# Patient Record
Sex: Female | Born: 1985 | Race: Black or African American | Hispanic: No | Marital: Married | Smoking: Never smoker
Health system: Southern US, Community
[De-identification: ages and names within clinical notes are randomized; demographics above are authoritative.]

## PROBLEM LIST (undated history)

## (undated) DIAGNOSIS — O139 Gestational [pregnancy-induced] hypertension without significant proteinuria, unspecified trimester: Secondary | ICD-10-CM

## (undated) HISTORY — PX: WISDOM TOOTH EXTRACTION: SHX21

---
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
## 2007-06-04 ENCOUNTER — Ambulatory Visit: Payer: Self-pay | Admitting: Internal Medicine

## 2013-06-23 ENCOUNTER — Encounter: Payer: Self-pay | Admitting: Gynecology

## 2013-06-23 ENCOUNTER — Ambulatory Visit (INDEPENDENT_AMBULATORY_CARE_PROVIDER_SITE_OTHER): Payer: BC Managed Care – PPO | Admitting: Gynecology

## 2013-06-23 VITALS — BP 100/62 | HR 68 | Resp 12 | Ht 63.25 in | Wt 155.0 lb

## 2013-06-23 DIAGNOSIS — Z124 Encounter for screening for malignant neoplasm of cervix: Secondary | ICD-10-CM

## 2013-06-23 DIAGNOSIS — Z01419 Encounter for gynecological examination (general) (routine) without abnormal findings: Secondary | ICD-10-CM

## 2013-06-23 DIAGNOSIS — Z0000 Encounter for general adult medical examination without abnormal findings: Secondary | ICD-10-CM

## 2013-06-23 DIAGNOSIS — Z309 Encounter for contraceptive management, unspecified: Secondary | ICD-10-CM

## 2013-06-23 LAB — POCT URINALYSIS DIPSTICK
UROBILINOGEN UA: NEGATIVE
pH, UA: 5

## 2013-06-23 LAB — HEMOGLOBIN, FINGERSTICK: HEMOGLOBIN, FINGERSTICK: 12.9 g/dL (ref 12.0–16.0)

## 2013-06-23 MED ORDER — NORETHIN ACE-ETH ESTRAD-FE 1-20 MG-MCG PO TABS: 1.0000 | ORAL_TABLET | Freq: Every day | ORAL | Status: DC

## 2013-06-23 NOTE — Patient Instructions (Signed)
Start ocp first day of flow and less break through bleeding and covered contraceptively

## 2013-06-23 NOTE — Progress Notes (Signed)
28 y.o. Married Caucasian female   G2P0020 here for annual exam. Pt is  currently sexually active.  She reports not using condoms on a regular basis.  First sexual activity at 28  years old, 8 number of lifetime partners.   Pt married 6/14, planning pregnancy 3-5y.  Pt reports history of elevated BP, not seen today but has run out of ocp.  Would like new Rx.   Patient's last menstrual period was 05/28/2013.          Sexually active: yes  The current method of family planning is OCP (estrogen/progesterone).    Exercising: yes  cardio, strength training 2-3x/wk Last pap: 05/2012 WNL Alcohol: 1 drink/wk Tobacco: no Drugs: no Gardisil: yes, completed: Freshmen year of college  Hgb: 12.9 ; Urine:  [place]   No health maintenance topics applied.  Family History  Problem Relation Age of Onset  . Hypertension Father     There are no active problems to display for this patient.   History reviewed. No pertinent past medical history.  History reviewed. No pertinent past surgical history.  Allergies: Asa  Current Outpatient Prescriptions  Medication Sig Dispense Refill  . Norethindrone Acet-Ethinyl Est (GILDESS 1/20 PO) Take by mouth.       No current facility-administered medications for this visit.    ROS: Pertinent items are noted in HPI.  Exam:    BP 100/62  Pulse 68  Resp 12  Ht 5' 3.25" (1.607 m)  Wt 155 lb (70.308 kg)  BMI 27.23 kg/m2  LMP 05/28/2013 Weight change: @WEIGHTCHANGE @ Last 3 height recordings:  Ht Readings from Last 3 Encounters:  06/23/13 5' 3.25" (1.607 m)   General appearance: alert, cooperative and appears stated age Head: Normocephalic, without obvious abnormality, atraumatic Neck: no adenopathy, no carotid bruit, no JVD, supple, symmetrical, trachea midline and thyroid not enlarged, symmetric, no tenderness/mass/nodules Lungs: clear to auscultation bilaterally Breasts: normal appearance, no masses or tenderness, fibrocystic right>left Heart:  regular rate and rhythm, S1, S2 normal, no murmur, click, rub or gallop Abdomen: soft, non-tender; bowel sounds normal; no masses,  no organomegaly Extremities: extremities normal, atraumatic, no cyanosis or edema Skin: Skin color, texture, turgor normal. No rashes or lesions Lymph nodes: Cervical, supraclavicular, and axillary nodes normal. no inguinal nodes palpated Neurologic: Grossly normal   Pelvic: External genitalia:  no lesions              Urethra: normal appearing urethra with no masses, tenderness or lesions              Bartholins and Skenes: normal                 Vagina: normal appearing vagina with normal color and discharge, no lesions              Cervix: normal appearance              Pap taken: yes        Bimanual Exam:  Uterus:  uterus is normal size, shape, consistency and nontender                                      Adnexa:    no masses  Rectovaginal: Confirms                                      Anus:  normal sphincter tone, no lesions  A: well woman no contraindication to begin use of oral contraceptives Contraceptive management     P: pap smear with reflex Pt with normal BP today off ocp, request ov in 93m to recheck BP, if elevated, will change contraception, 67m of loestrin 1/20 given counseled on breast self exam, use and side effects of OCP's, adequate intake of calcium and vitamin D, diet and exercise return annually or prn Discussed STD prevention, regular condom use.     An After Visit Summary was printed and given to the patient.

## 2013-06-25 LAB — IPS PAP TEST WITH REFLEX TO HPV

## 2013-06-28 ENCOUNTER — Telehealth: Payer: Self-pay | Admitting: *Deleted

## 2013-06-28 NOTE — Telephone Encounter (Signed)
Message copied by Lorraine LaxSHAW, Kellan Raffield J on Mon Jun 28, 2013 12:20 PM ------      Message from: Douglass RiversLATHROP, TRACY      Created: Mon Jun 28, 2013 11:16 AM       Inform bv on pap can treat if desires, tindamax 2g repeat in 1d 500mg , #8, recall 2 ------

## 2013-06-28 NOTE — Telephone Encounter (Signed)
Left Message To Call Back  

## 2013-07-02 ENCOUNTER — Telehealth: Payer: Self-pay | Admitting: *Deleted

## 2013-07-02 MED ORDER — TINIDAZOLE 500 MG PO TABS: 500.0000 mg | ORAL_TABLET | Freq: Once | ORAL | Status: DC

## 2013-07-02 NOTE — Telephone Encounter (Signed)
Patient notified see result note 

## 2013-07-02 NOTE — Telephone Encounter (Signed)
Message copied by Lorraine LaxSHAW, Sherlene Rickel J on Fri Jul 02, 2013 10:37 AM ------      Message from: Douglass RiversLATHROP, TRACY      Created: Mon Jun 28, 2013 11:16 AM       Inform bv on pap can treat if desires, tindamax 2g repeat in 1d 500mg , #8, recall 2 ------

## 2013-07-02 NOTE — Telephone Encounter (Signed)
Patient notified would like to have rx sent through to pharmacy. Tindamax 500 mg #8/0 refill [facility] to [facility] [place]. Patient is aware to take 4 tablets by mouth and then to repeat again in one day.  Routed to provider for review, encounter closed.

## 2013-09-22 ENCOUNTER — Ambulatory Visit: Payer: BC Managed Care – PPO | Admitting: Gynecology

## 2013-10-11 ENCOUNTER — Ambulatory Visit: Payer: BC Managed Care – PPO | Admitting: Gynecology

## 2013-10-15 ENCOUNTER — Ambulatory Visit (INDEPENDENT_AMBULATORY_CARE_PROVIDER_SITE_OTHER): Payer: BC Managed Care – PPO | Admitting: Gynecology

## 2013-10-15 VITALS — BP 112/60 | HR 76 | Resp 14 | Ht 63.25 in | Wt 156.0 lb

## 2013-10-15 DIAGNOSIS — Z3041 Encounter for surveillance of contraceptive pills: Secondary | ICD-10-CM

## 2013-10-15 MED ORDER — NORETHIN ACE-ETH ESTRAD-FE 1-20 MG-MCG PO TABS: 1.0000 | ORAL_TABLET | Freq: Every day | ORAL | Status: DC

## 2013-10-15 NOTE — Progress Notes (Signed)
Pt here for f/u of BP after starting lo-estrin 1/20, she has a history of elevated BP on her last ocp.  Pt states pill working well for her, cycles are very light to spotting. No dyspareunia.  ROS:neg BP: 112/60, prev 100/62  A/P: History of elevated bp on ocp, doing well on current pill,  Refill given until annual exam

## 2014-01-24 ENCOUNTER — Encounter: Payer: Self-pay | Admitting: Gynecology

## 2014-02-22 ENCOUNTER — Telehealth: Payer: Self-pay

## 2014-02-22 NOTE — Telephone Encounter (Signed)
lmtcb to reschedule AEX with Dr. Lathrop 

## 2014-05-30 ENCOUNTER — Other Ambulatory Visit: Payer: Self-pay | Admitting: *Deleted

## 2014-05-30 DIAGNOSIS — Z3041 Encounter for surveillance of contraceptive pills: Secondary | ICD-10-CM

## 2014-05-30 MED ORDER — NORETHIN ACE-ETH ESTRAD-FE 1-20 MG-MCG PO TABS: 1.0000 | ORAL_TABLET | Freq: Every day | ORAL | Status: DC

## 2014-05-30 NOTE — Telephone Encounter (Signed)
Medication refill request: OCP Last AEX:  06/23/13 TL Next AEX: 08/11/14 DL Last MMG (if hormonal medication request): None Refill authorized: 10/15/13 #3/2R. Needs refills until her appt 07/2014

## 2014-06-27 ENCOUNTER — Ambulatory Visit: Payer: BC Managed Care – PPO | Admitting: Gynecology

## 2014-08-11 ENCOUNTER — Ambulatory Visit (INDEPENDENT_AMBULATORY_CARE_PROVIDER_SITE_OTHER): Payer: BLUE CROSS/BLUE SHIELD | Admitting: Certified Nurse Midwife

## 2014-08-11 ENCOUNTER — Encounter: Payer: Self-pay | Admitting: Certified Nurse Midwife

## 2014-08-11 VITALS — BP 132/66 | HR 96 | Resp 18 | Ht 63.25 in | Wt 162.0 lb

## 2014-08-11 DIAGNOSIS — Z124 Encounter for screening for malignant neoplasm of cervix: Secondary | ICD-10-CM

## 2014-08-11 DIAGNOSIS — Z01419 Encounter for gynecological examination (general) (routine) without abnormal findings: Secondary | ICD-10-CM | POA: Diagnosis not present

## 2014-08-11 DIAGNOSIS — Z0000 Encounter for general adult medical examination without abnormal findings: Secondary | ICD-10-CM

## 2014-08-11 LAB — CBC
HCT: 41 % (ref 36.0–46.0)
Hemoglobin: 13.8 g/dL (ref 12.0–15.0)
MCH: 30.3 pg (ref 26.0–34.0)
MCHC: 33.7 g/dL (ref 30.0–36.0)
MCV: 89.9 fL (ref 78.0–100.0)
MPV: 11 fL (ref 8.6–12.4)
Platelets: 279 10*3/uL (ref 150–400)
RBC: 4.56 MIL/uL (ref 3.87–5.11)
RDW: 12.7 % (ref 11.5–15.5)
WBC: 10.5 10*3/uL (ref 4.0–10.5)

## 2014-08-11 LAB — POCT URINALYSIS DIPSTICK
Bilirubin, UA: NEGATIVE
Glucose, UA: NEGATIVE
KETONES UA: NEGATIVE
LEUKOCYTES UA: NEGATIVE
Nitrite, UA: NEGATIVE
PROTEIN UA: NEGATIVE
RBC UA: NEGATIVE
Urobilinogen, UA: NEGATIVE
pH, UA: 5

## 2014-08-11 LAB — POCT URINE PREGNANCY: Preg Test, Ur: NEGATIVE

## 2014-08-11 NOTE — Progress Notes (Signed)
29 y.o. G2P0020 Married  African American Fe here for annual exam. Periods normal. Contraception OCP stopped one month ago. Patient trying for pregnancy, not actively trying, but will be happy if occurs. Has not started on Prenatal vitamins. Patient is non smoker and has stopped any alcohol use. See PCP only if needed. No other health issues today.  Patient's last menstrual period was 07/11/2014.          Sexually active: Yes.    The current method of family planning is none.    Exercising: Yes.    Gym or at home work outs Smoker:  no  Health Maintenance: Pap:  06/23/13 Negative  MMG:  Never Self Breast Exam: yes, once a month  Gardasil completed. TDaP: ? 2006 Labs: Here today  UA= Negative UPT=Negative    reports that she has never smoked. She has never used smokeless tobacco. She reports that she drinks about 0.5 oz of alcohol per week. She reports that she does not use illicit drugs.  History reviewed. No pertinent past medical history.  History reviewed. No pertinent past surgical history.  No current outpatient prescriptions on file.   No current facility-administered medications for this visit.    Family History  Problem Relation Age of Onset  . Hypertension Father     ROS:  Pertinent items are noted in HPI.  Otherwise, a comprehensive ROS was negative.  Exam:   BP 132/66 mmHg  Pulse 96  Resp 18  Ht 5' 3.25" (1.607 m)  Wt 162 lb (73.483 kg)  BMI 28.45 kg/m2  LMP 07/11/2014 Height: 5' 3.25" (160.7 cm) Ht Readings from Last 3 Encounters:  08/11/14 5' 3.25" (1.607 m)  10/15/13 5' 3.25" (1.607 m)  06/23/13 5' 3.25" (1.607 m)    General appearance: alert, cooperative and appears stated age Head: Normocephalic, without obvious abnormality, atraumatic Neck: no adenopathy, supple, symmetrical, trachea midline and thyroid normal to inspection and palpation Lungs: clear to auscultation bilaterally Breasts: normal appearance, no masses or tenderness, No nipple retraction  or dimpling, No nipple discharge or bleeding, No axillary or supraclavicular adenopathy Heart: regular rate and rhythm Abdomen: soft, non-tender; no masses,  no organomegaly Extremities: extremities normal, atraumatic, no cyanosis or edema Skin: Skin color, texture, turgor normal. No rashes or lesions Lymph nodes: Cervical, supraclavicular, and axillary nodes normal. No abnormal inguinal nodes palpated Neurologic: Grossly normal   Pelvic: External genitalia:  no lesions              Urethra:  normal appearing urethra with no masses, tenderness or lesions              Bartholin's and Skene's: normal                 Vagina: normal appearing vagina with normal color and discharge, no lesions              Cervix: normal, non tender, no lesions              Pap taken: Yes.   Bimanual Exam:  Uterus:  normal size, contour, position, consistency, mobility, non-tender              Adnexa: normal adnexa and no mass, fullness, tenderness               Rectovaginal: Confirms               Anus:  normal sphincter tone, no lesions  Chaperone present: Yes  A:  Well Woman with normal  exam  Contraception none hopeful for pregnancy  Immunization update  P:   Reviewed health and wellness pertinent to exam  Discussed importance of starting prenatal vitamins and importance of folic acid in diet for healthy pregnancy. Patient will start on OTC. Recommend Rubella screen and CBC, patient agreeable. Discussed good diet, regular exercise during the time prior to conception. Given handout on preparing for pregnancy. Patient encouraged to call if no pregnancy in the next  9 months, can take up to one year. Questions addressed.  Discussed risks/benefits of TDAP and pregnancy concerns with Pertussis. Declines today.  Pap smear taken with HPV reflex   counseled on breast self exam, adequate intake of calcium and vitamin D, diet and exercise return annually or prn  An After Visit Summary was printed and given to  the patient.

## 2014-08-11 NOTE — Patient Instructions (Signed)
General topics  Next pap or exam is  due in 1 year Take a Women's multivitamin Take 1200 mg. of calcium daily - prefer dietary If any concerns in interim to call back  Breast Self-Awareness Practicing breast self-awareness may pick up problems early, prevent significant medical complications, and possibly save your life. By practicing breast self-awareness, you can become familiar with how your breasts look and feel and if your breasts are changing. This allows you to notice changes early. It can also offer you some reassurance that your breast health is good. One way to learn what is normal for your breasts and whether your breasts are changing is to do a breast self-exam. If you find a lump or something that was not present in the past, it is best to contact your caregiver right away. Other findings that should be evaluated by your caregiver include nipple discharge, especially if it is bloody; skin changes or reddening; areas where the skin seems to be pulled in (retracted); or new lumps and bumps. Breast pain is seldom associated with cancer (malignancy), but should also be evaluated by a caregiver. BREAST SELF-EXAM The best time to examine your breasts is 5 7 days after your menstrual period is over.  ExitCare Patient Information 2013 [employer].   Exercise to Stay Healthy Exercise helps you become and stay healthy. EXERCISE IDEAS AND TIPS Choose exercises that:  You enjoy.  Fit into your day. You do not need to exercise really hard to be healthy. You can do exercises at a slow or medium level and stay healthy. You can:  Stretch before and after working out.  Try yoga, Pilates, or tai chi.  Lift weights.  Walk fast, swim, jog, run, climb stairs, bicycle, dance, or rollerskate.  Take aerobic classes. Exercises that burn about 150 calories:  Running 1  miles in 15 minutes.  Playing volleyball for 45 to 60 minutes.  Washing and waxing a car for 45 to 60  minutes.  Playing touch football for 45 minutes.  Walking 1  miles in 35 minutes.  Pushing a stroller 1  miles in 30 minutes.  Playing basketball for 30 minutes.  Raking leaves for 30 minutes.  Bicycling 5 miles in 30 minutes.  Walking 2 miles in 30 minutes.  Dancing for 30 minutes.  Shoveling snow for 15 minutes.  Swimming laps for 20 minutes.  Walking up stairs for 15 minutes.  Bicycling 4 miles in 15 minutes.  Gardening for 30 to 45 minutes.  Jumping rope for 15 minutes.  Washing windows or floors for 45 to 60 minutes. Document Released: 04/13/2010 Document Revised: 06/03/2011 Document Reviewed: 04/13/2010 ExitCare Patient Information 2013 [employer].   Other topics ( that may be useful information):    Sexually Transmitted Disease Sexually transmitted disease (STD) refers to any infection that is passed from person to person during sexual activity. This may happen by way of saliva, semen, blood, vaginal mucus, or urine. Common STDs include:  Gonorrhea.  Chlamydia.  Syphilis.  HIV/AIDS.  Genital herpes.  Hepatitis B and C.  Trichomonas.  Human papillomavirus (HPV).  Pubic lice. CAUSES  An STD may be spread by bacteria, virus, or parasite. A person can get an STD by:  Sexual intercourse with an infected person.  Sharing sex toys with an infected person.  Sharing needles with an infected person.  Having intimate contact with the genitals, mouth, or rectal areas of an infected person. SYMPTOMS  Some people may not have any symptoms, but   they can still pass the infection to others. Different STDs have different symptoms. Symptoms include:  Painful or bloody urination.  Pain in the pelvis, abdomen, vagina, anus, throat, or eyes.  Skin rash, itching, irritation, growths, or sores (lesions). These usually occur in the genital or anal area.  Abnormal vaginal discharge.  Penile discharge in men.  Soft, flesh-colored skin growths in the  genital or anal area.  Fever.  Pain or bleeding during sexual intercourse.  Swollen glands in the groin area.  Yellow skin and eyes (jaundice). This is seen with hepatitis. DIAGNOSIS  To make a diagnosis, your caregiver may:  Take a medical history.  Perform a physical exam.  Take a specimen (culture) to be examined.  Examine a sample of discharge under a microscope.  Perform blood test TREATMENT   Chlamydia, gonorrhea, trichomonas, and syphilis can be cured with antibiotic medicine.  Genital herpes, hepatitis, and HIV can be treated, but not cured, with prescribed medicines. The medicines will lessen the symptoms.  Genital warts from HPV can be treated with medicine or by freezing, burning (electrocautery), or surgery. Warts may come back.  HPV is a virus and cannot be cured with medicine or surgery.However, abnormal areas may be followed very closely by your caregiver and may be removed from the cervix, vagina, or vulva through office procedures or surgery. If your diagnosis is confirmed, your recent sexual partners need treatment. This is true even if they are symptom-free or have a negative culture or evaluation. They should not have sex until their caregiver says it is okay. HOME CARE INSTRUCTIONS  All sexual partners should be informed, tested, and treated for all STDs.  Take your antibiotics as directed. Finish them even if you start to feel better.  Only take over-the-counter or prescription medicines for pain, discomfort, or fever as directed by your caregiver.  Rest.  Eat a balanced diet and drink enough fluids to keep your urine clear or pale yellow.  Do not have sex until treatment is completed and you have followed up with your caregiver. STDs should be checked after treatment.  Keep all follow-up appointments, Pap tests, and blood tests as directed by your caregiver.  Only use latex condoms and water-soluble lubricants during sexual activity. Do not use  petroleum jelly or oils.  Avoid alcohol and illegal drugs.  Get vaccinated for HPV and hepatitis. If you have not received these vaccines in the past, talk to your caregiver about whether one or both might be right for you.  Avoid risky sex practices that can break the skin. The only way to avoid getting an STD is to avoid all sexual activity.Latex condoms and dental dams (for oral sex) will help lessen the risk of getting an STD, but will not completely eliminate the risk. SEEK MEDICAL CARE IF:   You have a fever.  You have any new or worsening symptoms. Document Released: 06/01/2002 Document Revised: 06/03/2011 Document Reviewed: 06/08/2010 [facility] Patient Information 2013 [place].    Domestic Abuse You are being battered or abused if someone close to you hits, pushes, or physically hurts you in any way. You also are being abused if you are forced into activities. You are being sexually abused if you are forced to have sexual contact of any kind. You are being emotionally abused if you are made to feel worthless or if you are constantly threatened. It is important to remember that help is available. No one has the right to abuse you. PREVENTION OF FURTHER  ABUSE  Learn the warning signs of danger. This varies with situations but may include: the use of alcohol, threats, isolation from friends and family, or forced sexual contact. Leave if you feel that violence is going to occur.  If you are attacked or beaten, report it to the police so the abuse is documented. You do not have to press charges. The police can protect you while you or the attackers are leaving. Get the officer's name and badge number and a copy of the report.  Find someone you can trust and tell them what is happening to you: your caregiver, a nurse, clergy member, close friend or family member. Feeling ashamed is natural, but remember that you have done nothing wrong. No one deserves abuse. Document Released:  03/08/2000 Document Revised: 06/03/2011 Document Reviewed: 05/17/2010 ExitCare Patient Information 2013 [employer].    How Much is Too Much Alcohol? Drinking too much alcohol can cause injury, accidents, and health problems. These types of problems can include:   Car crashes.  Falls.  Family fighting (domestic violence).  Drowning.  Fights.  Injuries.  Burns.  Damage to certain organs.  Having a baby with birth defects. ONE DRINK CAN BE TOO MUCH WHEN YOU ARE:  Working.  Pregnant or breastfeeding.  Taking medicines. Ask your doctor.  Driving or planning to drive. If you or someone you know has a drinking problem, get help from a doctor.  Document Released: 01/05/2009 Document Revised: 06/03/2011 Document Reviewed: 01/05/2009 ExitCare Patient Information 2013 [employer].   Smoking Hazards Smoking cigarettes is extremely bad for your health. Tobacco smoke has over 200 known poisons in it. There are over 60 chemicals in tobacco smoke that cause cancer. Some of the chemicals found in cigarette smoke include:   Cyanide.  Benzene.  Formaldehyde.  Methanol (wood alcohol).  Acetylene (fuel used in welding torches).  Ammonia. Cigarette smoke also contains the poisonous gases nitrogen oxide and carbon monoxide.  Cigarette smokers have an increased risk of many serious medical problems and Smoking causes approximately:  90% of all lung cancer deaths in men.  80% of all lung cancer deaths in women.  90% of deaths from chronic obstructive lung disease. Compared with nonsmokers, smoking increases the risk of:  Coronary heart disease by 2 to 4 times.  Stroke by 2 to 4 times.  Men developing lung cancer by 23 times.  Women developing lung cancer by 13 times.  Dying from chronic obstructive lung diseases by 12 times.  . Smoking is the most preventable cause of death and disease in our society.  WHY IS SMOKING ADDICTIVE?  Nicotine is the chemical  agent in tobacco that is capable of causing addiction or dependence.  When you smoke and inhale, nicotine is absorbed rapidly into the bloodstream through your lungs. Nicotine absorbed through the lungs is capable of creating a powerful addiction. Both inhaled and non-inhaled nicotine may be addictive.  Addiction studies of cigarettes and spit tobacco show that addiction to nicotine occurs mainly during the teen years, when young people begin using tobacco products. WHAT ARE THE BENEFITS OF QUITTING?  There are many health benefits to quitting smoking.   Likelihood of developing cancer and heart disease decreases. Health improvements are seen almost immediately.  Blood pressure, pulse rate, and breathing patterns start returning to normal soon after quitting. QUITTING SMOKING   American Lung Association - 1-800-LUNGUSA  American Cancer Society - 1-800-ACS-2345 Document Released: 04/18/2004 Document Revised: 06/03/2011 Document Reviewed: 12/21/2008 ExitCare Patient Information 2013 ExitCare[employer]   LL[employer].   Stress Management Stress is a state of physical or mental tension that often results from changes in your life or normal routine. Some common causes of stress are:  Death of a loved one.  Injuries or severe illnesses.  Getting fired or changing jobs.  Moving into a new home. Other causes may be:  Sexual problems.  Business or financial losses.  Taking on a large debt.  Regular conflict with someone at home or at work.  Constant tiredness from lack of sleep. It is not just bad things that are stressful. It may be stressful to:  Win the lottery.  Get married.  Buy a new car. The amount of stress that can be easily tolerated varies from person to person. Changes generally cause stress, regardless of the types of change. Too much stress can affect your health. It may lead to physical or emotional problems. Too little stress (boredom) may also become stressful. SUGGESTIONS TO  REDUCE STRESS:  Talk things over with your family and friends. It often is helpful to share your concerns and worries. If you feel your problem is serious, you may want to get help from a professional counselor.  Consider your problems one at a time instead of lumping them all together. Trying to take care of everything at once may seem impossible. List all the things you need to do and then start with the most important one. Set a goal to accomplish 2 or 3 things each day. If you expect to do too many in a single day you will naturally fail, causing you to feel even more stressed.  Do not use alcohol or drugs to relieve stress. Although you may feel better for a short time, they do not remove the problems that caused the stress. They can also be habit forming.  Exercise regularly - at least 3 times per week. Physical exercise can help to relieve that "uptight" feeling and will relax you.  The shortest distance between despair and hope is often a good night's sleep.  Go to bed and get up on time allowing yourself time for appointments without being rushed.  Take a short "time-out" period from any stressful situation that occurs during the day. Close your eyes and take some deep breaths. Starting with the muscles in your face, tense them, hold it for a few seconds, then relax. Repeat this with the muscles in your neck, shoulders, hand, stomach, back and legs.  Take good care of yourself. Eat a balanced diet and get plenty of rest.  Schedule time for having fun. Take a break from your daily routine to relax. HOME CARE INSTRUCTIONS   Call if you feel overwhelmed by your problems and feel you can no longer manage them on your own.  Return immediately if you feel like hurting yourself or someone else. Document Released: 09/04/2000 Document Revised: 06/03/2011 Document Reviewed: 04/27/2007 [facility] Patient Information 2013 [place].  Prenatal Care  WHAT IS PRENATAL CARE?  Prenatal care  means health care during your pregnancy, before your baby is born. It is very important to take care of yourself and your baby during your pregnancy by:   Getting early prenatal care. If you know you are pregnant, or think you might be pregnant, call your health care provider as soon as possible. Schedule a visit for a prenatal exam.  Getting regular prenatal care. Follow your health care provider's schedule for blood and other necessary tests. Do not miss appointments.  Doing everything you can  to keep yourself and your baby healthy during your pregnancy.  Getting complete care. Prenatal care should include evaluation of the medical, dietary, educational, psychological, and social needs of you and your significant other. The medical and genetic history of your family and the family of your baby's father should be discussed with your health care provider.  Discussing with your health care provider:  Prescription, over-the-counter, and herbal medicines that you take.  Any history of substance abuse, alcohol use, smoking, and illegal drug use.  Any history of domestic abuse and violence.  Immunizations you have received.  Your nutrition and diet.  The amount of exercise you do.  Any environmental and occupational hazards to which you are exposed.  History of sexually transmitted infections for both you and your partner.  Previous pregnancies you have had. WHY IS PRENATAL CARE SO IMPORTANT?  By regularly seeing your health care provider, you help ensure that problems can be identified early so that they can be treated as soon as possible. Other problems might be prevented. Many studies have shown that early and regular prenatal care is important for the health of mothers and their babies.  HOW CAN I TAKE CARE OF MYSELF WHILE I AM PREGNANT?  Here are ways to take care of yourself and your baby:   Start or continue taking your multivitamin with 400 micrograms (mcg) of folic acid every  day.  Get early and regular prenatal care. It is very important to see a health care provider during your pregnancy. Your health care provider will check at each visit to make sure that you and your baby are healthy. If there are any problems, action can be taken right away to help you and your baby.  Eat a healthy diet that includes:  Fruits.  Vegetables.  Foods low in saturated fat.  Whole grains.  Calcium-rich foods, such as milk, yogurt, and hard cheeses.  Drink 6-8 glasses of liquids a day.  Unless your health care provider tells you not to, try to be physically active for 30 minutes, most days of the week. If you are pressed for time, you can get your activity in through 10-minute segments, three times a day.  Do not smoke, drink alcohol, or use drugs. These can cause long-term damage to your baby. Talk with your health care provider about steps to take to stop smoking. Talk with a member of your faith community, a counselor, a trusted friend, or your health care provider if you are concerned about your alcohol or drug use.  Ask your health care provider before taking any medicine, even over-the-counter medicines. Some medicines are not safe to take during pregnancy.  Get plenty of rest and sleep.  Avoid hot tubs and saunas during pregnancy.  Do not have X-rays taken unless absolutely necessary and with the recommendation of your health care provider. A lead shield can be placed on your abdomen to protect your baby when X-rays are taken in other parts of your body.  Do not empty the cat litter when you are pregnant. It may contain a parasite that causes an infection called toxoplasmosis, which can cause birth defects. Also, use gloves when working in garden areas used by cats.  Do not eat uncooked or undercooked meats or fish.  Do not eat soft, mold-ripened cheeses (Brie, Camembert, and chevre) or soft, blue-veined cheese (Danish blue and Roquefort).  Stay away from toxic  chemicals like:  Insecticides.  Solvents (some cleaners or paint thinners).  Lead.  Mercury.  Sexual intercourse may continue until the end of the pregnancy, unless you have a medical problem or there is a problem with the pregnancy and your health care provider tells you not to.  Do not wear high-heel shoes, especially during the second half of the pregnancy. You can lose your balance and fall.  Do not take long trips, unless absolutely necessary. Be sure to see your health care provider before going on the trip.  Do not sit in one position for more than 2 hours when on a trip.  Take a copy of your medical records when going on a trip. Know where a hospital is located in the city you are visiting, in case of an emergency.  Most dangerous household products will have pregnancy warnings on their labels. Ask your health care provider about products if you are unsure.  Limit or eliminate your caffeine intake from coffee, tea, sodas, medicines, and chocolate.  Many women continue working through pregnancy. Staying active might help you stay healthier. If you have a question about the safety or the hours you work at your particular job, talk with your health care provider.  Get informed:  Read books.  Watch videos.  Go to childbirth classes for you and your significant other.  Talk with experienced moms.  Ask your health care provider about childbirth education classes for you and your partner. Classes can help you and your partner prepare for the birth of your baby.  Ask about a baby doctor (pediatrician) and methods and pain medicine for labor, delivery, and possible cesarean delivery. HOW OFTEN SHOULD I SEE MY HEALTH CARE PROVIDER DURING PREGNANCY?  Your health care provider will give you a schedule for your prenatal visits. You will have visits more often as you get closer to the end of your pregnancy. An average pregnancy lasts about 40 weeks.  A typical schedule includes  visiting your health care provider:   About once each month during your first 6 months of pregnancy.  Every 2 weeks during the next 2 months.  Weekly in the last month, until the delivery date. Your health care provider will probably want to see you more often if:  You are older than 35 years.  Your pregnancy is high risk because you have certain health problems or problems with the pregnancy, such as:  Diabetes.  High blood pressure.  The baby is not growing on schedule, according to the dates of the pregnancy. Your health care provider will do special tests to make sure you and your baby are not having any serious problems. WHAT HAPPENS DURING PRENATAL VISITS?   At your first prenatal visit, your health care provider will do a physical exam and talk to you about your health history and the health history of your partner and your family. Your health care provider will be able to tell you what date to expect your baby to be born on.  Your first physical exam will include checks of your blood pressure, measurements of your height and weight, and an exam of your pelvic organs. Your health care provider will do a Pap test if you have not had one recently and will do cultures of your cervix to make sure there is no infection.  At each prenatal visit, there will be tests of your blood, urine, blood pressure, weight, and the progress of the baby will be checked.  At your later prenatal visits, your health care provider will check how you are doing and how your baby  is developing. You may have a number of tests done as your pregnancy progresses.  Ultrasound exams are often used to check on your baby's growth and health.  You may have more urine and blood tests, as well as special tests, if needed. These may include amniocentesis to examine fluid in the pregnancy sac, stress tests to check how the baby responds to contractions, or a biophysical profile to measure your baby's well-being. Your  health care provider will explain the tests and why they are necessary.  You should be tested for high blood sugar (gestational diabetes) between the 24th and 28th weeks of your pregnancy.  You should discuss with your health care provider your plans to breastfeed or bottle-feed your baby.  Each visit is also a chance for you to learn about staying healthy during pregnancy and to ask questions. Document Released: 03/14/2003 Document Revised: 03/16/2013 Document Reviewed: 05/26/2013 [facility] Patient Information 2015 [place], [place]. This information is not intended to replace advice given to you by your health care provider. Make sure you discuss any questions you have with your health care provider.

## 2014-08-11 NOTE — Progress Notes (Signed)
Reviewed personally.  M. Suzanne Venus Gilles, MD.  

## 2014-08-12 LAB — RUBELLA SCREEN: Rubella: 7.28 Index — ABNORMAL HIGH (ref <0.90)

## 2014-08-13 LAB — IPS PAP TEST WITH REFLEX TO HPV

## 2014-08-22 ENCOUNTER — Other Ambulatory Visit: Payer: Self-pay | Admitting: Certified Nurse Midwife

## 2014-08-23 NOTE — Telephone Encounter (Signed)
Medication refill request: Junel  Last AEX:  08/11/14 DL Next AEX: [id number]/455/25/[id number] DL Last MMG (if hormonal medication request):  Refill authorized: pt no longer on OCP

## 2014-10-23 ENCOUNTER — Encounter: Payer: Self-pay | Admitting: *Deleted

## 2014-10-23 ENCOUNTER — Emergency Department: Payer: BLUE CROSS/BLUE SHIELD

## 2014-10-23 ENCOUNTER — Emergency Department
Admission: EM | Admit: 2014-10-23 | Discharge: 2014-10-23 | Disposition: A | Discharge status: Home / Self Care | Payer: BLUE CROSS/BLUE SHIELD | Attending: Emergency Medicine | Admitting: Emergency Medicine

## 2014-10-23 DIAGNOSIS — Y92410 Unspecified street and highway as the place of occurrence of the external cause: Secondary | ICD-10-CM | POA: Diagnosis not present

## 2014-10-23 DIAGNOSIS — S20211A Contusion of right front wall of thorax, initial encounter: Secondary | ICD-10-CM | POA: Insufficient documentation

## 2014-10-23 DIAGNOSIS — Z79899 Other long term (current) drug therapy: Secondary | ICD-10-CM | POA: Diagnosis not present

## 2014-10-23 DIAGNOSIS — S1081XA Abrasion of other specified part of neck, initial encounter: Secondary | ICD-10-CM | POA: Insufficient documentation

## 2014-10-23 DIAGNOSIS — Y998 Other external cause status: Secondary | ICD-10-CM | POA: Insufficient documentation

## 2014-10-23 DIAGNOSIS — Y9389 Activity, other specified: Secondary | ICD-10-CM | POA: Diagnosis not present

## 2014-10-23 DIAGNOSIS — S299XXA Unspecified injury of thorax, initial encounter: Secondary | ICD-10-CM | POA: Diagnosis present

## 2014-10-23 MED ORDER — CYCLOBENZAPRINE HCL 10 MG PO TABS: 10.0000 mg | ORAL_TABLET | Freq: Three times a day (TID) | ORAL | Status: DC | PRN

## 2014-10-23 MED ORDER — IBUPROFEN 800 MG PO TABS
800.0000 mg | ORAL_TABLET | Freq: Once | ORAL | Status: AC
  Administered 2014-10-23: 800 mg via ORAL
  Filled 2014-10-23: qty 1

## 2014-10-23 MED ORDER — CYCLOBENZAPRINE HCL 10 MG PO TABS
10.0000 mg | ORAL_TABLET | Freq: Once | ORAL | Status: AC
  Administered 2014-10-23: 10 mg via ORAL
  Filled 2014-10-23: qty 1

## 2014-10-23 MED ORDER — IBUPROFEN 800 MG PO TABS: 800.0000 mg | ORAL_TABLET | Freq: Three times a day (TID) | ORAL | Status: DC | PRN
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
## 2014-10-23 NOTE — Discharge Instructions (Signed)
Chest Contusion °A chest contusion is a deep bruise on your chest area. Contusions are the result of an injury that caused bleeding under the skin. A chest contusion may involve bruising of the skin, muscles, or ribs. The contusion may turn blue, purple, or yellow. Minor injuries will give you a painless contusion, but more severe contusions may stay painful and swollen for a few weeks. °CAUSES  °A contusion is usually caused by a blow, trauma, or direct force to an area of the body. °SYMPTOMS  °· Swelling and redness of the injured area. °· Discoloration of the injured area. °· Tenderness and soreness of the injured area. °· Pain. °DIAGNOSIS  °The diagnosis can be made by taking a history and performing a physical exam. An X-ray, CT scan, or MRI may be needed to determine if there were any associated injuries, such as broken bones (fractures) or internal injuries. °TREATMENT  °Often, the best treatment for a chest contusion is resting, icing, and applying cold compresses to the injured area. Deep breathing exercises may be recommended to reduce the risk of pneumonia. Over-the-counter medicines may also be recommended for pain control. °HOME CARE INSTRUCTIONS  °· Put ice on the injured area. °· Put ice in a plastic bag. °· Place a towel between your skin and the bag. °· Leave the ice on for 15-20 minutes, 03-04 times a day. °· Only take over-the-counter or prescription medicines as directed by your caregiver. Your caregiver may recommend avoiding anti-inflammatory medicines (aspirin, ibuprofen, and naproxen) for 48 hours because these medicines may increase bruising. °· Rest the injured area. °· Perform deep-breathing exercises as directed by your caregiver. °· Stop smoking if you smoke. °· Do not lift objects over 5 pounds (2.3 kg) for 3 days or longer if recommended by your caregiver. °SEEK IMMEDIATE MEDICAL CARE IF:  °· You have increased bruising or swelling. °· You have pain that is getting worse. °· You have  difficulty breathing. °· You have dizziness, weakness, or fainting. °· You have blood in your urine or stool. °· You cough up or vomit blood. °· Your swelling or pain is not relieved with medicines. °MAKE SURE YOU:  °· Understand these instructions. °· Will watch your condition. °· Will get help right away if you are not doing well or get worse. °Document Released: 12/04/2000 Document Revised: 12/04/2011 Document Reviewed: 09/02/2011 °[employer]® Patient Information ©2015 [employer]. This information is not intended to replace advice given to you by your health care provider. Make sure you discuss any questions you have with your health care provider. ° °Motor Vehicle Collision °It is common to have multiple bruises and sore muscles after a motor vehicle collision (MVC). These tend to feel worse for the first 24 hours. You may have the most stiffness and soreness over the first several hours. You may also feel worse when you wake up the first morning after your collision. After this point, you will usually begin to improve with each day. The speed of improvement often depends on the severity of the collision, the number of injuries, and the location and nature of these injuries. °HOME CARE INSTRUCTIONS °· Put ice on the injured area. °¨ Put ice in a plastic bag. °¨ Place a towel between your skin and the bag. °¨ Leave the ice on for 15-20 minutes, 3-4 times a day, or as directed by your health care provider. °· Drink enough fluids to keep your urine clear or pale yellow. Do not drink alcohol. °· Take a   warm shower or bath once or twice a day. This will increase blood flow to sore muscles. °· You may return to activities as directed by your caregiver. Be careful when lifting, as this may aggravate neck or back pain. °· Only take over-the-counter or prescription medicines for pain, discomfort, or fever as directed by your caregiver. Do not use aspirin. This may increase bruising and bleeding. °SEEK IMMEDIATE MEDICAL  CARE IF: °· You have numbness, tingling, or weakness in the arms or legs. °· You develop severe headaches not relieved with medicine. °· You have severe neck pain, especially tenderness in the middle of the back of your neck. °· You have changes in bowel or bladder control. °· There is increasing pain in any area of the body. °· You have shortness of breath, light-headedness, dizziness, or fainting. °· You have chest pain. °· You feel sick to your stomach (nauseous), throw up (vomit), or sweat. °· You have increasing abdominal discomfort. °· There is blood in your urine, stool, or vomit. °· You have pain in your shoulder (shoulder strap areas). °· You feel your symptoms are getting worse. °MAKE SURE YOU: °· Understand these instructions. °· Will watch your condition. °· Will get help right away if you are not doing well or get worse. °Document Released: 03/11/2005 Document Revised: 07/26/2013 Document Reviewed: 08/08/2010 °Exit[employer]® Patient Information ©2015 [employer]. This information is not intended to replace advice given to you by your health care provider. Make sure you discuss any questions you have with your health care provider. ° °

## 2015-08-17 ENCOUNTER — Ambulatory Visit (INDEPENDENT_AMBULATORY_CARE_PROVIDER_SITE_OTHER): Payer: BLUE CROSS/BLUE SHIELD | Admitting: Certified Nurse Midwife

## 2015-08-17 ENCOUNTER — Encounter: Payer: Self-pay | Admitting: Certified Nurse Midwife

## 2015-08-17 VITALS — BP 104/68 | HR 72 | Resp 16 | Ht 63.25 in | Wt 157.0 lb

## 2015-08-17 DIAGNOSIS — Z30011 Encounter for initial prescription of contraceptive pills: Secondary | ICD-10-CM

## 2015-08-17 DIAGNOSIS — Z01419 Encounter for gynecological examination (general) (routine) without abnormal findings: Secondary | ICD-10-CM | POA: Diagnosis not present

## 2015-08-17 DIAGNOSIS — Z0000 Encounter for general adult medical examination without abnormal findings: Secondary | ICD-10-CM

## 2015-08-17 LAB — POCT URINALYSIS DIPSTICK
Bilirubin, UA: NEGATIVE
GLUCOSE UA: NEGATIVE
Ketones, UA: NEGATIVE
LEUKOCYTES UA: NEGATIVE
NITRITE UA: NEGATIVE
Protein, UA: NEGATIVE
RBC UA: NEGATIVE
UROBILINOGEN UA: NEGATIVE
pH, UA: 5

## 2015-08-17 MED ORDER — NORETHIN ACE-ETH ESTRAD-FE 1-20 MG-MCG PO TABS: 1.0000 | ORAL_TABLET | Freq: Every day | ORAL | Status: DC

## 2015-08-17 NOTE — Progress Notes (Signed)
30 y.o. G2P0020 Married  African American Fe here for annual exam. Periods normal, no issues. Has been using NFP over the past year with ovulation patterns, now would like to be back on OCP. Used Loestrin in the past with no issues. Had BP elevation with another OCP, and used POP previous to our practice. Non smoker, no migraine headache issues. Sees Urgent care if needed. No other health issues today. Planning a cruise this summer!  Patient's last menstrual period was 07/28/2015.          Sexually active: Yes.    The current method of family planning is rhythm method.    Exercising: Yes.    cardio & weights Smoker:  no  Health Maintenance: Pap:  08-11-14 neg MMG:  none Colonoscopy:  none BMD:   none TDaP:  2006 Shingles: no Pneumonia: no Hep C and HIV: none Labs: poct urine-neg, hgb-13.8 Self breast exam: done monthly   reports that she has never smoked. She has never used smokeless tobacco. She reports that she drinks about 0.6 oz of alcohol per week. She reports that she does not use illicit drugs.  History reviewed. No pertinent past medical history.  History reviewed. No pertinent past surgical history.  Current Outpatient Prescriptions  Medication Sig Dispense Refill  . prenatal vitamin w/FE, FA (NATACHEW) 29-1 MG CHEW chewable tablet Chew 1 tablet by mouth daily at 12 noon.     No current facility-administered medications for this visit.    Family History  Problem Relation Age of Onset  . Hypertension Father     ROS:  Pertinent items are noted in HPI.  Otherwise, a comprehensive ROS was negative.  Exam:   BP 104/68 mmHg  Pulse 72  Resp 16  Ht 5' 3.25" (1.607 m)  Wt 157 lb (71.215 kg)  BMI 27.58 kg/m2  LMP 07/28/2015 Height: 5' 3.25" (160.7 cm) Ht Readings from Last 3 Encounters:  08/17/15 5' 3.25" (1.607 m)  10/23/14 5\' 4"  (1.626 m)  08/11/14 5' 3.25" (1.607 m)    General appearance: alert, cooperative and appears stated age Head: Normocephalic, without  obvious abnormality, atraumatic Neck: no adenopathy, supple, symmetrical, trachea midline and thyroid normal to inspection and palpation Lungs: clear to auscultation bilaterally Breasts: normal appearance, no masses or tenderness, No nipple retraction or dimpling, No nipple discharge or bleeding, No axillary or supraclavicular adenopathy Heart: regular rate and rhythm Abdomen: soft, non-tender; no masses,  no organomegaly Extremities: extremities normal, atraumatic, no cyanosis or edema Skin: Skin color, texture, turgor normal. No rashes or lesions Lymph nodes: Cervical, supraclavicular, and axillary nodes normal. No abnormal inguinal nodes palpated Neurologic: Grossly normal   Pelvic: External genitalia:  no lesions              Urethra:  normal appearing urethra with no masses, tenderness or lesions              Bartholin's and Skene's: normal                 Vagina: normal appearing vagina with normal color and discharge, no lesions              Cervix: no cervical motion tenderness, no lesions and nulliparous appearance              Pap taken: No. Bimanual Exam:  Uterus:  normal size, contour, position, consistency, mobility, non-tender and anteverted              Adnexa: normal adnexa and no  mass, fullness, tenderness               Rectovaginal: Confirms               Anus:  normal appearance  Chaperone present: yes  A:  Well Woman with normal exam  Contraception change desired, would like to return to OCP used before with no BP change  Immunization due    P:   Reviewed health and wellness pertinent to exam  Risks/benefits/bleeding expectations,warning signs and side effects reviewed. Questions addressed.  Rx Loestrin 1/20 Fe see order with written instructions given. Return in 2 months for BP check or having issues with OCP  Patient has to prepare for injections, declines today, but will update at 2 month check.  Pap smear as above not taken   counseled on breast self exam,  use and side effects of OCP's, adequate intake of calcium and vitamin D, diet and exercise  return annually or prn  An After Visit Summary was printed and given to the patient.

## 2015-08-17 NOTE — Patient Instructions (Signed)
EXERCISE AND DIET:  We recommended that you start or continue a regular exercise program for good health. Regular exercise means any activity that makes your heart beat faster and makes you sweat.  We recommend exercising at least 30 minutes per day at least 3 days a week, preferably 4 or 5.  We also recommend a diet low in fat and sugar.  Inactivity, poor dietary choices and obesity can cause diabetes, heart attack, stroke, and kidney damage, among others.    ALCOHOL AND SMOKING:  Women should limit their alcohol intake to no more than 7 drinks/beers/glasses of wine (combined, not each!) per week. Moderation of alcohol intake to this level decreases your risk of breast cancer and liver damage. And of course, no recreational drugs are part of a healthy lifestyle.  And absolutely no smoking or even second hand smoke. Most people know smoking can cause heart and lung diseases, but did you know it also contributes to weakening of your bones? Aging of your skin?  Yellowing of your teeth and nails?  CALCIUM AND VITAMIN D:  Adequate intake of calcium and Vitamin D are recommended.  The recommendations for exact amounts of these supplements seem to change often, but generally speaking 600 mg of calcium (either carbonate or citrate) and 800 units of Vitamin D per day seems prudent. Certain women may benefit from higher intake of Vitamin D.  If you are among these women, your doctor will have told you during your visit.    PAP SMEARS:  Pap smears, to check for cervical cancer or precancers,  have traditionally been done yearly, although recent scientific advances have shown that most women can have pap smears less often.  However, every woman still should have a physical exam from her gynecologist every year. It will include a breast check, inspection of the vulva and vagina to check for abnormal growths or skin changes, a visual exam of the cervix, and then an exam to evaluate the size and shape of the uterus and  ovaries.  And after 30 years of age, a rectal exam is indicated to check for rectal cancers. We will also provide age appropriate advice regarding health maintenance, like when you should have certain vaccines, screening for sexually transmitted diseases, bone density testing, colonoscopy, mammograms, etc.   MAMMOGRAMS:  All women over 30 years old should have a yearly mammogram. Many facilities now offer a "3D" mammogram, which may cost around $50 extra out of pocket. If possible,  we recommend you accept the option to have the 3D mammogram performed.  It both reduces the number of women who will be called back for extra views which then turn out to be normal, and it is better than the routine mammogram at detecting truly abnormal areas.    COLONOSCOPY:  Colonoscopy to screen for colon cancer is recommended for all women at age 30.  We know, you hate the idea of the prep.  We agree, BUT, having colon cancer and not knowing it is worse!!  Colon cancer so often starts as a polyp that can be seen and removed at colonscopy, which can quite literally save your life!  And if your first colonoscopy is normal and you have no family history of colon cancer, most women don't have to have it again for 10 years.  Once every ten years, you can do something that may end up saving your life, right?  We will be happy to help you get it scheduled when you are ready.    Be sure to check your insurance coverage so you understand how much it will cost.  It may be covered as a preventative service at no cost, but you should check your particular policy.     Oral Contraception Use Oral contraceptive pills (OCPs) are medicines taken to prevent pregnancy. OCPs work by preventing the ovaries from releasing eggs. The hormones in OCPs also cause the cervical mucus to thicken, preventing the sperm from entering the uterus. The hormones also cause the uterine lining to become thin, not allowing a fertilized egg to attach to the inside of  the uterus. OCPs are highly effective when taken exactly as prescribed. However, OCPs do not prevent sexually transmitted diseases (STDs). Safe sex practices, such as using condoms along with an OCP, can help prevent STDs. Before taking OCPs, you may have a physical exam and Pap test. Your health care provider may also order blood tests if necessary. Your health care provider will make sure you are a good candidate for oral contraception. Discuss with your health care provider the possible side effects of the OCP you may be prescribed. When starting an OCP, it can take 2 to 3 months for the body to adjust to the changes in hormone levels in your body.  HOW TO TAKE ORAL CONTRACEPTIVE PILLS Your health care provider may advise you on how to start taking the first cycle of OCPs. Otherwise, you can:   Start on day 1 of your menstrual period. You will not need any backup contraceptive protection with this start time.   Start on the first Sunday after your menstrual period or the day you get your prescription. In these cases, you will need to use backup contraceptive protection for the first week.   Start the pill at any time of your cycle. If you take the pill within 5 days of the start of your period, you are protected against pregnancy right away. In this case, you will not need a backup form of birth control. If you start at any other time of your menstrual cycle, you will need to use another form of birth control for 7 days. If your OCP is the type called a minipill, it will protect you from pregnancy after taking it for 2 days (48 hours). After you have started taking OCPs:   If you forget to take 1 pill, take it as soon as you remember. Take the next pill at the regular time.   If you miss 2 or more pills, call your health care provider because different pills have different instructions for missed doses. Use backup birth control until your next menstrual period starts.   If you use a 28-day  pack that contains inactive pills and you miss 1 of the last 7 pills (pills with no hormones), it will not matter. Throw away the rest of the non-hormone pills and start a new pill pack.  No matter which day you start the OCP, you will always start a new pack on that same day of the week. Have an extra pack of OCPs and a backup contraceptive method available in case you miss some pills or lose your OCP pack.  HOME CARE INSTRUCTIONS   Do not smoke.   Always use a condom to protect against STDs. OCPs do not protect against STDs.   Use a calendar to mark your menstrual period days.   Read the information and directions that came with your OCP. Talk to your health care provider if you have questions.  SEEK MEDICAL CARE IF:   You develop nausea and vomiting.   You have abnormal vaginal discharge or bleeding.   You develop a rash.   You miss your menstrual period.   You are losing your hair.   You need treatment for mood swings or depression.   You get dizzy when taking the OCP.   You develop acne from taking the OCP.   You become pregnant.  SEEK IMMEDIATE MEDICAL CARE IF:   You develop chest pain.   You develop shortness of breath.   You have an uncontrolled or severe headache.   You develop numbness or slurred speech.   You develop visual problems.   You develop pain, redness, and swelling in the legs.    This information is not intended to replace advice given to you by your health care provider. Make sure you discuss any questions you have with your health care provider.   Document Released: 02/28/2011 Document Revised: 04/01/2014 Document Reviewed: 08/30/2012 Elsevier Interactive Patient Education 2016 [employer]. Oral Contraception Information Oral contraceptive pills (OCPs) are medicines taken to prevent pregnancy. OCPs work by preventing the ovaries from releasing eggs. The hormones in OCPs also cause the cervical mucus to thicken, preventing  the sperm from entering the uterus. The hormones also cause the uterine lining to become thin, not allowing a fertilized egg to attach to the inside of the uterus. OCPs are highly effective when taken exactly as prescribed. However, OCPs do not prevent sexually transmitted diseases (STDs). Safe sex practices, such as using condoms along with the pill, can help prevent STDs.  Before taking the pill, you may have a physical exam and Pap test. Your health care provider may order blood tests. The health care provider will make sure you are a good candidate for oral contraception. Discuss with your health care provider the possible side effects of the OCP you may be prescribed. When starting an OCP, it can take 2 to 3 months for the body to adjust to the changes in hormone levels in your body.  TYPES OF ORAL CONTRACEPTION  The combination pill--This pill contains estrogen and progestin (synthetic progesterone) hormones. The combination pill comes in 21-day, 28-day, or 91-day packs. Some types of combination pills are meant to be taken continuously (365-day pills). With 21-day packs, you do not take pills for 7 days after the last pill. With 28-day packs, the pill is taken every day. The last 7 pills are without hormones. Certain types of pills have more than 21 hormone-containing pills. With 91-day packs, the first 84 pills contain both hormones, and the last 7 pills contain no hormones or contain estrogen only.  The minipill--This pill contains the progesterone hormone only. The pill is taken every day continuously. It is very important to take the pill at the same time each day. The minipill comes in packs of 28 pills. All 28 pills contain the hormone.  ADVANTAGES OF ORAL CONTRACEPTIVE PILLS  Decreases premenstrual symptoms.   Treats menstrual period cramps.   Regulates the menstrual cycle.   Decreases a heavy menstrual flow.   May treatacne, depending on the type of pill.   Treats abnormal  uterine bleeding.   Treats polycystic ovarian syndrome.   Treats endometriosis.   Can be used as emergency contraception.  THINGS THAT CAN MAKE ORAL CONTRACEPTIVE PILLS LESS EFFECTIVE OCPs can be less effective if:   You forget to take the pill at the same time every day.   You have a stomach or  intestinal disease that lessens the absorption of the pill.   You take OCPs with other medicines that make OCPs less effective, such as antibiotics, certain HIV medicines, and some seizure medicines.   You take expired OCPs.   You forget to restart the pill on day 7, when using the packs of 21 pills.  RISKS ASSOCIATED WITH ORAL CONTRACEPTIVE PILLS  Oral contraceptive pills can sometimes cause side effects, such as:  Headache.  Nausea.  Breast tenderness.  Irregular bleeding or spotting. Combination pills are also associated with a small increased risk of:  Blood clots.  Heart attack.  Stroke.   This information is not intended to replace advice given to you by your health care provider. Make sure you discuss any questions you have with your health care provider.   Document Released: 06/01/2002 Document Revised: 12/30/2012 Document Reviewed: 08/30/2012 Elsevier Interactive Patient Education [employer]2016 Elsevier Inc.

## 2015-08-18 NOTE — Progress Notes (Signed)
Reviewed personally.  M. Suzanne Mckenna Boruff, MD.  

## 2015-08-28 LAB — HEMOGLOBIN, FINGERSTICK: HEMOGLOBIN, FINGERSTICK: 13.8 g/dL (ref 12.0–16.0)

## 2015-10-18 ENCOUNTER — Ambulatory Visit: Payer: BLUE CROSS/BLUE SHIELD | Admitting: Certified Nurse Midwife

## 2015-10-18 ENCOUNTER — Telehealth: Payer: Self-pay | Admitting: Certified Nurse Midwife

## 2015-10-18 NOTE — Telephone Encounter (Signed)
Patient canceled her appointment today (had to leave out of town) unexpectedly. Patient rescheduled to 11/01/15.

## 2015-11-01 ENCOUNTER — Ambulatory Visit (INDEPENDENT_AMBULATORY_CARE_PROVIDER_SITE_OTHER): Payer: BLUE CROSS/BLUE SHIELD | Admitting: Certified Nurse Midwife

## 2015-11-01 ENCOUNTER — Encounter: Payer: Self-pay | Admitting: Certified Nurse Midwife

## 2015-11-01 VITALS — BP 108/62 | HR 72 | Resp 16 | Ht 63.25 in | Wt 160.0 lb

## 2015-11-01 DIAGNOSIS — Z3041 Encounter for surveillance of contraceptive pills: Secondary | ICD-10-CM | POA: Diagnosis not present

## 2015-11-01 DIAGNOSIS — Z23 Encounter for immunization: Secondary | ICD-10-CM

## 2015-11-01 NOTE — Progress Notes (Signed)
30 y.o. Single African American [id number] here for evaluation of Junel 1/20 Fe initiated on 08/17/15 for contraception. Menses duration 2-3 days with light flow. Patient taking medication as prescribed. Denies missed pills, headaches, nausea, DVT warning signs or symptoms,  breakthrough bleeding, or other changes.   Keeping menses calendar. Also here for TDAP. No other health issues today  O: Healthy female, WD WN Affect: normal orientation X 3    A: History of borderline hypertension with resolution with weight loss, trial of Junel 1/20 Fe Immunization update   P: Continue  as prescribed, has Rx. Will check BP periodically and advise if change. Reviewed OCP warning signs and need to advise. Request TDAP.   18 minutes spent with patient with >50% of time spent in face to face counseling.  RV 6 months BP check  Tdap given in R Deltoid. -sco

## 2015-11-01 NOTE — Patient Instructions (Signed)
Oral Contraception Use Oral contraceptive pills (OCPs) are medicines taken to prevent pregnancy. OCPs work by preventing the ovaries from releasing eggs. The hormones in OCPs also cause the cervical mucus to thicken, preventing the sperm from entering the uterus. The hormones also cause the uterine lining to become thin, not allowing a fertilized egg to attach to the inside of the uterus. OCPs are highly effective when taken exactly as prescribed. However, OCPs do not prevent sexually transmitted diseases (STDs). Safe sex practices, such as using condoms along with an OCP, can help prevent STDs. Before taking OCPs, you may have a physical exam and Pap test. Your health care provider may also order blood tests if necessary. Your health care provider will make sure you are a good candidate for oral contraception. Discuss with your health care provider the possible side effects of the OCP you may be prescribed. When starting an OCP, it can take 2 to 3 months for the body to adjust to the changes in hormone levels in your body.  HOW TO TAKE ORAL CONTRACEPTIVE PILLS Your health care provider may advise you on how to start taking the first cycle of OCPs. Otherwise, you can:   Start on day 1 of your menstrual period. You will not need any backup contraceptive protection with this start time.   Start on the first Sunday after your menstrual period or the day you get your prescription. In these cases, you will need to use backup contraceptive protection for the first week.   Start the pill at any time of your cycle. If you take the pill within 5 days of the start of your period, you are protected against pregnancy right away. In this case, you will not need a backup form of birth control. If you start at any other time of your menstrual cycle, you will need to use another form of birth control for 7 days. If your OCP is the type called a minipill, it will protect you from pregnancy after taking it for 2 days (48  hours). After you have started taking OCPs:   If you forget to take 1 pill, take it as soon as you remember. Take the next pill at the regular time.   If you miss 2 or more pills, call your health care provider because different pills have different instructions for missed doses. Use backup birth control until your next menstrual period starts.   If you use a 28-day pack that contains inactive pills and you miss 1 of the last 7 pills (pills with no hormones), it will not matter. Throw away the rest of the non-hormone pills and start a new pill pack.  No matter which day you start the OCP, you will always start a new pack on that same day of the week. Have an extra pack of OCPs and a backup contraceptive method available in case you miss some pills or lose your OCP pack.  HOME CARE INSTRUCTIONS   Do not smoke.   Always use a condom to protect against STDs. OCPs do not protect against STDs.   Use a calendar to mark your menstrual period days.   Read the information and directions that came with your OCP. Talk to your health care provider if you have questions.  SEEK MEDICAL CARE IF:   You develop nausea and vomiting.   You have abnormal vaginal discharge or bleeding.   You develop a rash.   You miss your menstrual period.   You are losing   your hair.   You need treatment for mood swings or depression.   You get dizzy when taking the OCP.   You develop acne from taking the OCP.   You become pregnant.  SEEK IMMEDIATE MEDICAL CARE IF:   You develop chest pain.   You develop shortness of breath.   You have an uncontrolled or severe headache.   You develop numbness or slurred speech.   You develop visual problems.   You develop pain, redness, and swelling in the legs.    This information is not intended to replace advice given to you by your health care provider. Make sure you discuss any questions you have with your health care provider.   Document  Released: 02/28/2011 Document Revised: 04/01/2014 Document Reviewed: 08/30/2012 Elsevier Interactive Patient Education 2016 Elsevier Inc.  

## 2015-11-06 NOTE — Progress Notes (Signed)
Encounter reviewed. BP 120/84 Gertie ExonJill Jameshia Hayashida, MD

## 2016-04-03 ENCOUNTER — Telehealth: Payer: Self-pay | Admitting: Certified Nurse Midwife

## 2016-04-03 NOTE — Telephone Encounter (Signed)
Spoke with patient. Patient states she is taking Junel Fe for birth control. Has taken her pills at the same time daily without missing any pills. Patient was due to start her menses on 04/01/2016 and has not started yet. Patient took two UPT this morning that were both negative. Advised patient it is not uncommon to miss cycles while on birth control. Advised with negative UPT and without missing any pills or taking pills late she is okay to proceed with taking OCP. Patient is agreeable and verbalizes understanding.  Routing to provider for final review. Patient agreeable to disposition. Will close encounter.

## 2016-04-03 NOTE — Telephone Encounter (Signed)
Patient needs to speak to someone about her birth control and that she has not started her period and should have started Sunday 04/01/16.

## 2016-05-03 ENCOUNTER — Encounter: Payer: Self-pay | Admitting: Certified Nurse Midwife

## 2016-05-03 ENCOUNTER — Ambulatory Visit (INDEPENDENT_AMBULATORY_CARE_PROVIDER_SITE_OTHER): Payer: BLUE CROSS/BLUE SHIELD | Admitting: Certified Nurse Midwife

## 2016-05-03 VITALS — BP 120/82 | HR 72 | Resp 16 | Ht 63.25 in | Wt 167.0 lb

## 2016-05-03 DIAGNOSIS — Z113 Encounter for screening for infections with a predominantly sexual mode of transmission: Secondary | ICD-10-CM

## 2016-05-03 DIAGNOSIS — Z3041 Encounter for surveillance of contraceptive pills: Secondary | ICD-10-CM

## 2016-05-03 LAB — HEPATITIS C ANTIBODY: HCV Ab: NEGATIVE

## 2016-05-03 NOTE — Progress Notes (Signed)
31 y.o. Married [place] female [id number] here with complaint of bump she notices after [place] wax once monthly. No pustular area, just slightly sore, no redness that she is aware of. No vaginal symptoms or increase in discharge. Also here for follow up of OCP Junel 1/20 Fe use.  Denies missed pills or side side effects or warning signs. Had very light period one month only. Otherwise normal periods, minimal cramping, no PMS. Happy with OCP choice. ? STD concerns. Urinary symptoms none .  No other concerns. Desires STD screening.  ROS: Pertinent to HPI.  O:Healthy female WDWN Affect: normal, orientation x 3  Exam: Abdomen:soft, non tender  Inguinal Lymph nodes: no enlargement or tenderness Pelvic exam: External genital: normal female, with very small sebaceous cyst noted on left external labia area at 12 o'clock, non tender, no redness, soft, mobile BUS: negative   Vagina: normal appearing discharge noted. , Affirm taken Cervix: normal, non tender, no CMT Uterus: normal, non tender Adnexa:normal, non tender, no masses or fullness noted    A:Normal pelvic exam Sebaceous cyst of left labia Normal OCP surveillance STD screening   P:Discussed findings of normal pelvic exam. Discussed Epsom salt sitz bath for comfort, if sebaceous cyst becomes tender after waxing.. Avoid squeezing area. Warning signs reviewed and will need to advise if occurs. Try spacing waxing a little further a part if possible.  Reviewed warning signs with OCP and need to advise if occurs. Discussed STD prevention if concerns with spouse. Questions addressed.  Rv prn, aex

## 2016-05-04 LAB — WET PREP BY MOLECULAR PROBE
Candida species: NEGATIVE
GARDNERELLA VAGINALIS: NEGATIVE
Trichomonas vaginosis: NEGATIVE

## 2016-05-04 LAB — RPR

## 2016-05-04 LAB — HIV ANTIBODY (ROUTINE TESTING W REFLEX): HIV 1&2 Ab, 4th Generation: NONREACTIVE

## 2016-05-04 LAB — GC/CHLAMYDIA PROBE AMP
CT PROBE, AMP APTIMA: NOT DETECTED
GC Probe RNA: NOT DETECTED

## 2016-05-04 NOTE — Progress Notes (Signed)
Encounter reviewed Shedric Fredericks, MD   

## 2016-06-07 ENCOUNTER — Other Ambulatory Visit: Payer: Self-pay | Admitting: Certified Nurse Midwife

## 2016-06-07 DIAGNOSIS — Z30011 Encounter for initial prescription of contraceptive pills: Secondary | ICD-10-CM

## 2016-06-10 NOTE — Telephone Encounter (Signed)
Medication refill request: OCP Last AEX:  08/17/15 DL Next AEX: 4/09/815/30/18 DL Last MMG (if hormonal medication request): none Refill authorized: 08/17/15 #1pack/12R. Today #3packs/0R?  [facility]

## 2016-08-21 ENCOUNTER — Encounter: Payer: Self-pay | Admitting: Certified Nurse Midwife

## 2016-08-21 ENCOUNTER — Ambulatory Visit (INDEPENDENT_AMBULATORY_CARE_PROVIDER_SITE_OTHER): Payer: BLUE CROSS/BLUE SHIELD | Admitting: Certified Nurse Midwife

## 2016-08-21 ENCOUNTER — Other Ambulatory Visit (HOSPITAL_COMMUNITY)
Admission: RE | Admit: 2016-08-21 | Discharge: 2016-08-21 | Disposition: A | Discharge status: Home / Self Care | Payer: BLUE CROSS/BLUE SHIELD | Source: Ambulatory Visit | Attending: Certified Nurse Midwife | Admitting: Certified Nurse Midwife

## 2016-08-21 VITALS — BP 120/80 | HR 70 | Resp 16 | Ht 63.25 in | Wt 166.0 lb

## 2016-08-21 DIAGNOSIS — Z01419 Encounter for gynecological examination (general) (routine) without abnormal findings: Secondary | ICD-10-CM | POA: Diagnosis not present

## 2016-08-21 DIAGNOSIS — Z30011 Encounter for initial prescription of contraceptive pills: Secondary | ICD-10-CM | POA: Diagnosis not present

## 2016-08-21 DIAGNOSIS — Z124 Encounter for screening for malignant neoplasm of cervix: Secondary | ICD-10-CM | POA: Diagnosis not present

## 2016-08-21 MED ORDER — NORETHIN ACE-ETH ESTRAD-FE 1-20 MG-MCG PO TABS: 1.0000 | ORAL_TABLET | Freq: Every day | ORAL | 4 refills | Status: DC

## 2016-08-21 NOTE — Patient Instructions (Signed)

## 2016-08-21 NOTE — Progress Notes (Signed)
31 y.o. G2P0020 Married  African American Fe here for annual exam. Periods normal, no issues. Contraception working well. Still  Working in [employer] and enjoying. No health issues today. [place]   Patient's last menstrual period was 08/15/2016 (exact date).          Sexually active: Yes.    The current method of family planning is OCP (estrogen/progesterone).    Exercising: Yes.    cardio & weights Smoker:  no  Health Maintenance: Pap:  08-11-14 neg History of Abnormal Pap: no MMG:  none Self Breast exams: occ Colonoscopy:  none BMD:   none TDaP:  2017 Shingles: no Pneumonia: no Hep C and HIV: both neg 2 2018 Labs: none   reports that she has never smoked. She has never used smokeless tobacco. She reports that she drinks about 0.6 oz of alcohol per week . She reports that she does not use drugs.  History reviewed. No pertinent past medical history.  History reviewed. No pertinent surgical history.  Current Outpatient Prescriptions  Medication Sig Dispense Refill  . JUNEL FE 1/20 1-20 MG-MCG tablet TAKE 1 TABLET BY MOUTH DAILY. 3 Package 0   No current facility-administered medications for this visit.     Family History  Problem Relation Age of Onset  . Hypertension Father   . Stroke Paternal Aunt   . Stroke Paternal Uncle   . Stroke Paternal Grandmother     ROS:  Pertinent items are noted in HPI.  Otherwise, a comprehensive ROS was negative.  Exam:   BP 120/80   Pulse 70   Resp 16   Ht 5' 3.25" (1.607 m)   Wt 166 lb (75.3 kg)   LMP 08/15/2016 (Exact Date)   BMI 29.17 kg/m  Height: 5' 3.25" (160.7 cm) Ht Readings from Last 3 Encounters:  08/21/16 5' 3.25" (1.607 m)  05/03/16 5' 3.25" (1.607 m)  11/01/15 5' 3.25" (1.607 m)    General appearance: alert, cooperative and appears stated age Head: Normocephalic, without obvious abnormality, atraumatic Neck: no adenopathy, supple, symmetrical, trachea midline and thyroid normal to inspection and  palpation Lungs: clear to auscultation bilaterally Breasts: normal appearance, no masses or tenderness, No nipple retraction or dimpling, No nipple discharge or bleeding, No axillary or supraclavicular adenopathy Heart: regular rate and rhythm Abdomen: soft, non-tender; no masses,  no organomegaly Extremities: extremities normal, atraumatic, no cyanosis or edema Skin: Skin color, texture, turgor normal. No rashes or lesions Lymph nodes: Cervical, supraclavicular, and axillary nodes normal. No abnormal inguinal nodes palpated Neurologic: Grossly normal   Pelvic: External genitalia:  no lesions              Urethra:  normal appearing urethra with no masses, tenderness or lesions              Bartholin's and Skene's: normal                 Vagina: normal appearing vagina with normal color and discharge, no lesions              Cervix: no bleeding following Pap, no cervical motion tenderness, no lesions and nulliparous appearance              Pap taken: Yes.   Bimanual Exam:  Uterus:  normal size, contour, position, consistency, mobility, non-tender              Adnexa: normal adnexa and no mass, fullness, tenderness  Rectovaginal: Confirms               Anus:  normal sphincter tone, no lesions  Chaperone present: yes  A:  Well Woman with normal exam  Contraception OCP desired  P:   Reviewed health and wellness pertinent to exam  Risks and benefits/warning signs reviewed for use OCP use.  Rx Junel Fe 1/20 see order with instructions  Pap smear: yes   counseled on breast self exam, adequate intake of calcium and vitamin D, diet and exercise return annually or prn  An After Visit Summary was printed and given to the patient.

## 2016-08-26 LAB — CYTOLOGY - PAP
DIAGNOSIS: NEGATIVE
HPV: NOT DETECTED

## 2016-09-05 ENCOUNTER — Other Ambulatory Visit: Payer: Self-pay | Admitting: Nurse Practitioner

## 2016-09-05 DIAGNOSIS — Z30011 Encounter for initial prescription of contraceptive pills: Secondary | ICD-10-CM

## 2016-09-05 NOTE — Telephone Encounter (Signed)
Medication refill request: JUNEL FE 1/20 Last AEX:  08/21/16 DL Next AEX: 1/6/106/5/19 Last MMG (if hormonal medication request): n/a Refill authorized: 08/21/16 #3packs w/4 refills; today refused.

## 2017-02-07 ENCOUNTER — Telehealth: Payer: Self-pay | Admitting: Certified Nurse Midwife

## 2017-02-07 NOTE — Telephone Encounter (Signed)
Patient took a OTC pregnancy test this morning with a positive result. Patient is asking what is her next step?

## 2017-02-07 NOTE — Telephone Encounter (Signed)
Spoke with patient. Patient states that she took a pregnancy test this morning that was positive. LMP was 01/07/2017. Denies any concerns or problems. Advised will need to be seen for pregnancy confirmation. Appointment scheduled for 02/12/2017 at 10 am with Leota Sauerseborah Leonard CNM. Patient is agreeable to date and time.  Routing to provider for final review. Patient agreeable to disposition. Will close encounter.

## 2017-02-12 ENCOUNTER — Ambulatory Visit (INDEPENDENT_AMBULATORY_CARE_PROVIDER_SITE_OTHER): Payer: BLUE CROSS/BLUE SHIELD | Admitting: Certified Nurse Midwife

## 2017-02-12 ENCOUNTER — Encounter: Payer: Self-pay | Admitting: Certified Nurse Midwife

## 2017-02-12 ENCOUNTER — Other Ambulatory Visit: Payer: Self-pay

## 2017-02-12 VITALS — BP 124/84 | HR 72 | Resp 16 | Ht 63.25 in | Wt 175.0 lb

## 2017-02-12 DIAGNOSIS — N912 Amenorrhea, unspecified: Secondary | ICD-10-CM

## 2017-02-12 DIAGNOSIS — Z3201 Encounter for pregnancy test, result positive: Secondary | ICD-10-CM | POA: Diagnosis not present

## 2017-02-12 LAB — POCT URINE PREGNANCY: Preg Test, Ur: POSITIVE — AB

## 2017-02-12 NOTE — Progress Notes (Signed)
[age] y.o. married female g0p0  presents with amenorrhea with + UPT on 02/07/17. LMP 01/07/17.  Planned pregnancy. Complaining of breast tenderness, minor fatigue,no nausea. Mild cramping no change or bleeding  Noted. Denies spotting, bleeding. Medications she is taking are: prenatal medications. Pt has not consumed alcohol since + UPT. Spouse supportive and here with her. Previously exercised and plans to continue.  Eating and drinking normally. Drinks water only. No soda. She does protein shake in the mornings, this works better for her. No other concerns today  ROS pertinent to above  O: HPI pertinent to above. Healthy WDWN female Affect: normal, orientation x 3  Last Aex:08/21/16 Pap smear:08/21/16 negative             Rubella screen:not drawn TDAP up to date for both  A: Amenorrhea with positive UPT  5 wk 3 days [facility]. Planned  pregnancy   P: Reviewed with patient importance of prenatal care during pregnancy. Given OB provider list. Reviewed nutrition importance of pregnancy and selecting from all food groups and making sure to have adequate protein intake daily. Discussed avoiding raw or exotic fish, soft cheeses due to risk of bacteria . Discussed concerns with FAS with alcohol use in pregnancy. Discussed increase of IUGR and SIDS with smoking use or second smoke. Patient or spouse does not smoke. Reviewed warning signs of early pregnancy and need to advise if occurs. Discussed comfort measures for early pregnancy changes. Offered viability PUS here prior to initiating prenatal care. Patient plans to have PUS. She will be called with insurance information and scheduled. Questions addressed at length regarding care choices and availability.  Labs: none  Rv prn   30 minutes ofTime spent with patient in face to face counseling regarding pregnancy and prenatal care

## 2017-02-12 NOTE — Patient Instructions (Signed)

## 2017-02-17 ENCOUNTER — Other Ambulatory Visit: Payer: Self-pay | Admitting: *Deleted

## 2017-02-17 ENCOUNTER — Telehealth: Payer: Self-pay | Admitting: Certified Nurse Midwife

## 2017-02-17 DIAGNOSIS — Z3201 Encounter for pregnancy test, result positive: Secondary | ICD-10-CM

## 2017-02-17 DIAGNOSIS — N912 Amenorrhea, unspecified: Secondary | ICD-10-CM

## 2017-02-17 NOTE — Telephone Encounter (Signed)
Call placed to patient to review benefits and scheduled appointment for a recommended ultrasound. Left voicemail message requesting a return call.

## 2017-02-17 NOTE — Telephone Encounter (Signed)
Patient returned call. Reviewed benefits for ultrasound. Patient understood and agreeable. Patient ready to schedule. Patient scheduled 02/27/17 with Dr Hyacinth MeekerMiller. Patient aware of appointment date, arrival time and cancellation policy. No further questions. Ok to close   cc: Dr Hyacinth MeekerMiller  cc: Leota Sauerseborah Leonard, CNM

## 2017-02-27 ENCOUNTER — Encounter: Payer: Self-pay | Admitting: Obstetrics & Gynecology

## 2017-02-27 ENCOUNTER — Ambulatory Visit (INDEPENDENT_AMBULATORY_CARE_PROVIDER_SITE_OTHER): Payer: BLUE CROSS/BLUE SHIELD

## 2017-02-27 ENCOUNTER — Ambulatory Visit (INDEPENDENT_AMBULATORY_CARE_PROVIDER_SITE_OTHER): Payer: BLUE CROSS/BLUE SHIELD | Admitting: Obstetrics & Gynecology

## 2017-02-27 VITALS — BP 110/88 | HR 96 | Resp 16 | Ht 63.25 in

## 2017-02-27 DIAGNOSIS — O3680X1 Pregnancy with inconclusive fetal viability, fetus 1: Secondary | ICD-10-CM | POA: Diagnosis not present

## 2017-02-27 DIAGNOSIS — N912 Amenorrhea, unspecified: Secondary | ICD-10-CM | POA: Diagnosis not present

## 2017-02-27 DIAGNOSIS — Z3201 Encounter for pregnancy test, result positive: Secondary | ICD-10-CM | POA: Diagnosis not present

## 2017-02-27 NOTE — Progress Notes (Signed)
31 y.o. 503P0020 Married [place] female here for pelvic ultrasound due to amenorrhea with positive pregnancy test.  She is having some mild nausea.  She is having some mild RLQ pain.  Denies vaginal bleeding.  No emesis.  Taking PNV.  Patient's last menstrual period was 01/07/2017 (exact date).  Findings:  UTERUS: normal appearing gestational sac, yolk sac, fetal pole measuring just over 1cm.  +FCA at 141bpm noted.  Small 7 x 7mm subchorionic hemorrhage noted.  EGA 7 1/7 weeks which agrees with LMP EGA of 7 2/7 weeks.  [facility] ADNEXA: Left ovary: 2.0 x 2.8cm with 1.6cm corpus luteal cyst       Right ovary: 2.5 x 1.5cm CUL DE SAC: no free fluid  Discussion:  Reviewed findings with pt.  Miscarriage risk at this point reviewed.    No cats in the home. Advised no changing kitty litter.  Tdap 2017.  Had chicken pox.  Aware flu vaccine safe and recommended.  Has not had it yet  Fish/shellfish/mercury discuss.   Unpasteurized cheese/juices discussed.  Nitrites in foods disucssed.  Exercise and intercourse discussed.  Fetal DNA particle testing discussed.  First trimester down's testing discussed.  Cystic fibrosis discussed.    Sickle cell testing discussed.  Neither family has sickle cell disease or trait present.  Assessment:  Mason JimSingleton IUD at 7 2/7 weeks by LMP, confirmed by first trimester ultrasound today  Plan:  Transfer of care appropriate.  [facility]  Release of records signed.  ~15 minutes spent with patient >50% of time was in face to face discussion of above.

## 2017-03-25 NOTE — L&D Delivery Note (Signed)
Delivery Note Pt progressed to complete dilation and pushed great.  At 9:25 AM a healthy female was delivered via Vaginal, Spontaneous (Presentation: LOA).  APGAR: 9, 9; weight  pending Placenta status: delivered spontaneously .  Cord:  with the following complications:none .   Anesthesia:  epidural Episiotomy: None Lacerations: Periurethral abrasions Suture Repair: 3.0 vicryl rapide Est. Blood Loss (mL): 175  Mom to postpartum.  Baby to Couplet care / Skin to Skin.  D/w pt circumcison and they desire to proceed in the hospital  Oliver PilaKathy W Kenshin Splawn 10/11/2017, 10:03 AM

## 2017-04-10 LAB — OB RESULTS CONSOLE RUBELLA ANTIBODY, IGM: Rubella: IMMUNE

## 2017-04-10 LAB — OB RESULTS CONSOLE HIV ANTIBODY (ROUTINE TESTING): HIV: NONREACTIVE

## 2017-04-10 LAB — OB RESULTS CONSOLE ANTIBODY SCREEN: Antibody Screen: NEGATIVE

## 2017-04-10 LAB — OB RESULTS CONSOLE ABO/RH: RH TYPE: POSITIVE

## 2017-04-10 LAB — OB RESULTS CONSOLE GBS: STREP GROUP B AG: POSITIVE

## 2017-04-10 LAB — OB RESULTS CONSOLE GC/CHLAMYDIA
CHLAMYDIA, DNA PROBE: NEGATIVE
Gonorrhea: NEGATIVE

## 2017-04-10 LAB — OB RESULTS CONSOLE RPR: RPR: NONREACTIVE

## 2017-04-10 LAB — OB RESULTS CONSOLE HEPATITIS B SURFACE ANTIGEN: HEP B S AG: NEGATIVE
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
## 2017-08-27 ENCOUNTER — Ambulatory Visit: Payer: BLUE CROSS/BLUE SHIELD | Admitting: Certified Nurse Midwife

## 2017-10-10 ENCOUNTER — Inpatient Hospital Stay (HOSPITAL_COMMUNITY)
Admission: AD | Admit: 2017-10-10 | Discharge: 2017-10-13 | DRG: 807 | Disposition: A | Discharge status: Home / Self Care | Payer: BLUE CROSS/BLUE SHIELD | Attending: Obstetrics and Gynecology | Admitting: Obstetrics and Gynecology

## 2017-10-10 ENCOUNTER — Encounter (HOSPITAL_COMMUNITY): Payer: Self-pay | Admitting: *Deleted

## 2017-10-10 DIAGNOSIS — Z3A39 39 weeks gestation of pregnancy: Secondary | ICD-10-CM

## 2017-10-10 DIAGNOSIS — O99824 Streptococcus B carrier state complicating childbirth: Secondary | ICD-10-CM | POA: Diagnosis present

## 2017-10-10 DIAGNOSIS — O133 Gestational [pregnancy-induced] hypertension without significant proteinuria, third trimester: Secondary | ICD-10-CM | POA: Diagnosis not present

## 2017-10-10 DIAGNOSIS — O134 Gestational [pregnancy-induced] hypertension without significant proteinuria, complicating childbirth: Principal | ICD-10-CM | POA: Diagnosis present

## 2017-10-10 DIAGNOSIS — R030 Elevated blood-pressure reading, without diagnosis of hypertension: Secondary | ICD-10-CM | POA: Diagnosis present

## 2017-10-10 HISTORY — DX: Gestational (pregnancy-induced) hypertension without significant proteinuria, unspecified trimester: O13.9

## 2017-10-10 LAB — COMPREHENSIVE METABOLIC PANEL
ALBUMIN: 3 g/dL — AB (ref 3.5–5.0)
ALT: 23 U/L (ref 0–44)
ANION GAP: 9 (ref 5–15)
AST: 21 U/L (ref 15–41)
Alkaline Phosphatase: 119 U/L (ref 38–126)
BUN: 10 mg/dL (ref 6–20)
CALCIUM: 9.5 mg/dL (ref 8.9–10.3)
CO2: 21 mmol/L — AB (ref 22–32)
Chloride: 104 mmol/L (ref 98–111)
Creatinine, Ser: 0.92 mg/dL (ref 0.44–1.00)
GFR calc non Af Amer: >60 mL/min (ref >60)
GLUCOSE: 82 mg/dL (ref 70–99)
POTASSIUM: 4.7 mmol/L (ref 3.5–5.1)
SODIUM: 134 mmol/L — AB (ref 135–145)
Total Bilirubin: 0.7 mg/dL (ref 0.3–1.2)
Total Protein: 6.4 g/dL — ABNORMAL LOW (ref 6.5–8.1)

## 2017-10-10 LAB — CBC
HCT: 31.6 % — ABNORMAL LOW (ref 36.0–46.0)
HCT: 32.6 % — ABNORMAL LOW (ref 36.0–46.0)
HEMOGLOBIN: 11.2 g/dL — AB (ref 12.0–15.0)
Hemoglobin: 11.2 g/dL — ABNORMAL LOW (ref 12.0–15.0)
MCH: 31.5 pg (ref 26.0–34.0)
MCH: 30.6 pg (ref 26.0–34.0)
MCHC: 35.4 g/dL (ref 30.0–36.0)
MCHC: 34.4 g/dL (ref 30.0–36.0)
MCV: 89 fL (ref 78.0–100.0)
MCV: 89.1 fL (ref 78.0–100.0)
PLATELETS: 226 10*3/uL (ref 150–400)
PLATELETS: 222 10*3/uL (ref 150–400)
RBC: 3.55 MIL/uL — ABNORMAL LOW (ref 3.87–5.11)
RBC: 3.66 MIL/uL — AB (ref 3.87–5.11)
RDW: 14.1 % (ref 11.5–15.5)
RDW: 14.1 % (ref 11.5–15.5)
WBC: 8.8 10*3/uL (ref 4.0–10.5)
WBC: 9.6 10*3/uL (ref 4.0–10.5)

## 2017-10-10 LAB — PROTEIN / CREATININE RATIO, URINE
Creatinine, Urine: 40 mg/dL
Total Protein, Urine: <6 mg/dL

## 2017-10-10 LAB — TYPE AND SCREEN
ABO/RH(D): O POS
Antibody Screen: NEGATIVE

## 2017-10-10 LAB — ABO/RH: ABO/RH(D): O POS

## 2017-10-10 MED ORDER — OXYTOCIN 40 UNITS IN LACTATED RINGERS INFUSION - SIMPLE MED
1.0000 m[IU]/min | INTRAVENOUS | Status: DC
  Administered 2017-10-10: 2 m[IU]/min via INTRAVENOUS
  Filled 2017-10-10: qty 1000

## 2017-10-10 MED ORDER — LIDOCAINE HCL (PF) 1 % IJ SOLN
30.0000 mL | INTRAMUSCULAR | Status: DC | PRN
  Filled 2017-10-10: qty 30

## 2017-10-10 MED ORDER — LACTATED RINGERS IV SOLN: INTRAVENOUS | Status: DC

## 2017-10-10 MED ORDER — LACTATED RINGERS IV SOLN: 500.0000 mL | INTRAVENOUS | Status: DC | PRN

## 2017-10-10 MED ORDER — EPHEDRINE 5 MG/ML INJ
10.0000 mg | INTRAVENOUS | Status: DC | PRN
  Filled 2017-10-10: qty 2

## 2017-10-10 MED ORDER — DIPHENHYDRAMINE HCL 50 MG/ML IJ SOLN: 12.5000 mg | INTRAMUSCULAR | Status: DC | PRN

## 2017-10-10 MED ORDER — OXYCODONE-ACETAMINOPHEN 5-325 MG PO TABS
1.0000 | ORAL_TABLET | ORAL | Status: DC | PRN
Start: 2017-10-10 — End: 2017-10-11

## 2017-10-10 MED ORDER — PHENYLEPHRINE 40 MCG/ML (10ML) SYRINGE FOR IV PUSH (FOR BLOOD PRESSURE SUPPORT)
80.0000 ug | PREFILLED_SYRINGE | INTRAVENOUS | Status: DC | PRN
  Filled 2017-10-10: qty 5
  Filled 2017-10-10 (×2): qty 10

## 2017-10-10 MED ORDER — ONDANSETRON HCL 4 MG/2ML IJ SOLN: 4.0000 mg | Freq: Four times a day (QID) | INTRAMUSCULAR | Status: DC | PRN

## 2017-10-10 MED ORDER — OXYTOCIN 40 UNITS IN LACTATED RINGERS INFUSION - SIMPLE MED: 2.5000 [IU]/h | INTRAVENOUS | Status: DC

## 2017-10-10 MED ORDER — LACTATED RINGERS IV SOLN
INTRAVENOUS | Status: DC
  Administered 2017-10-10: 20:00:00 via INTRAVENOUS

## 2017-10-10 MED ORDER — FENTANYL CITRATE (PF) 100 MCG/2ML IJ SOLN
50.0000 ug | INTRAMUSCULAR | Status: DC | PRN
  Administered 2017-10-11 (×2): 100 ug via INTRAVENOUS
  Filled 2017-10-10 (×2): qty 2

## 2017-10-10 MED ORDER — LABETALOL HCL 5 MG/ML IV SOLN: 20.0000 mg | INTRAVENOUS | Status: DC | PRN

## 2017-10-10 MED ORDER — PENICILLIN G POT IN DEXTROSE 60000 UNIT/ML IV SOLN
3.0000 10*6.[IU] | INTRAVENOUS | Status: DC
  Administered 2017-10-11 (×2): 3 10*6.[IU] via INTRAVENOUS
  Filled 2017-10-10 (×7): qty 50

## 2017-10-10 MED ORDER — SOD CITRATE-CITRIC ACID 500-334 MG/5ML PO SOLN: 30.0000 mL | ORAL | Status: DC | PRN

## 2017-10-10 MED ORDER — OXYCODONE-ACETAMINOPHEN 5-325 MG PO TABS: 2.0000 | ORAL_TABLET | ORAL | Status: DC | PRN

## 2017-10-10 MED ORDER — OXYTOCIN BOLUS FROM INFUSION
500.0000 mL | Freq: Once | INTRAVENOUS | Status: AC
  Administered 2017-10-11: 500 mL via INTRAVENOUS

## 2017-10-10 MED ORDER — ACETAMINOPHEN 325 MG PO TABS: 650.0000 mg | ORAL_TABLET | ORAL | Status: DC | PRN

## 2017-10-10 MED ORDER — PHENYLEPHRINE 40 MCG/ML (10ML) SYRINGE FOR IV PUSH (FOR BLOOD PRESSURE SUPPORT)
80.0000 ug | PREFILLED_SYRINGE | INTRAVENOUS | Status: DC | PRN
  Filled 2017-10-10: qty 5

## 2017-10-10 MED ORDER — LABETALOL HCL 100 MG PO TABS
100.0000 mg | ORAL_TABLET | Freq: Two times a day (BID) | ORAL | Status: DC
  Administered 2017-10-10 – 2017-10-11 (×2): 100 mg via ORAL
  Filled 2017-10-10 (×2): qty 1

## 2017-10-10 MED ORDER — SODIUM CHLORIDE 0.9 % IV SOLN
5.0000 10*6.[IU] | Freq: Once | INTRAVENOUS | Status: AC
  Administered 2017-10-10: 5 10*6.[IU] via INTRAVENOUS
  Filled 2017-10-10: qty 5

## 2017-10-10 MED ORDER — LACTATED RINGERS IV SOLN: 500.0000 mL | Freq: Once | INTRAVENOUS | Status: DC

## 2017-10-10 MED ORDER — FENTANYL 2.5 MCG/ML BUPIVACAINE 1/10 % EPIDURAL INFUSION (WH - ANES)
14.0000 mL/h | INTRAMUSCULAR | Status: DC | PRN
  Administered 2017-10-11: 14 mL/h via EPIDURAL
  Filled 2017-10-10 (×2): qty 100

## 2017-10-10 MED ORDER — HYDRALAZINE HCL 20 MG/ML IJ SOLN: 10.0000 mg | Freq: Once | INTRAMUSCULAR | Status: DC | PRN

## 2017-10-10 MED ORDER — TERBUTALINE SULFATE 1 MG/ML IJ SOLN
0.2500 mg | Freq: Once | INTRAMUSCULAR | Status: DC | PRN
  Filled 2017-10-10: qty 1

## 2017-10-10 NOTE — MAU Provider Note (Signed)
Chief Complaint  Patient presents with  . Hypertension     First Provider Initiated Contact with Patient 10/10/17 1725      S: Lynn May  is a 32 y.o. y.o. year old 313P0020 female at 4344w3d weeks gestation who presents to MAU with elevated blood pressures. Denies Hx HTN prior to pregnancy. Elevated BPs towards the end of the pregnancy. Was started on meds last week. Current blood pressure medication: labetalol 100 mg   Associated symptoms: Reports Headaches that come & go without treatment (currently no headache), denies vision changes, denies epigastric pain Contractions: denies Vaginal bleeding: denies Fetal movement: denies  O:  Patient Vitals for the past 24 hrs:  BP Temp Temp src Pulse Resp Height Weight  10/10/17 1815 (!) 156/100 - - 83 - - -  10/10/17 1800 (!) 151/90 - - 89 - - -  10/10/17 1745 (!) 153/96 - - 95 - - -  10/10/17 1735 (!) 157/93 - - 90 - - -  10/10/17 1725 (!) 162/100 98.5 F (36.9 C) Oral 92 18 - -  10/10/17 1716 - - - - - 5' 3.5" (1.613 m) 213 lb (96.6 kg)   General: NAD Heart: Regular rate Lungs: Normal rate and effort Abd: Soft, NT, Gravid, S=D Extremities: 3+ pitting Pedal edema Neuro: 2+ deep tendon reflexes, No clonus   EFM: 145, Moderate variability, 15 x 15 accelerations, no decelerations Toco: Q 3-5  Results for orders placed or performed during the hospital encounter of 10/10/17 (from the past 24 hour(s))  Protein / creatinine ratio, urine     Status: None (Preliminary result)   Collection Time: 10/10/17  5:19 PM  Result Value Ref Range   Creatinine, Urine 40.00 mg/dL   Total Protein, Urine PENDING mg/dL   Protein Creatinine Ratio PENDING 0.00 - 0.15 mg/mg[Cre]  CBC     Status: Abnormal   Collection Time: 10/10/17  5:46 PM  Result Value Ref Range   WBC 8.8 4.0 - 10.5 K/uL   RBC 3.55 (L) 3.87 - 5.11 MIL/uL   Hemoglobin 11.2 (L) 12.0 - 15.0 g/dL   HCT 16.131.6 (L) 09.636.0 - 04.546.0 %   MCV 89.0 78.0 - 100.0 fL   MCH 31.5 26.0 - 34.0 pg   MCHC 35.4 30.0 - 36.0 g/dL   RDW 40.914.1 81.111.5 - 91.415.5 %   Platelets 226 150 - 400 K/uL  Comprehensive metabolic panel     Status: Abnormal   Collection Time: 10/10/17  5:46 PM  Result Value Ref Range   Sodium 134 (L) 135 - 145 mmol/L   Potassium 4.7 3.5 - 5.1 mmol/L   Chloride 104 98 - 111 mmol/L   CO2 21 (L) 22 - 32 mmol/L   Glucose, Bld 82 70 - 99 mg/dL   BUN 10 6 - 20 mg/dL   Creatinine, Ser 7.820.92 0.44 - 1.00 mg/dL   Calcium 9.5 8.9 - 95.610.3 mg/dL   Total Protein 6.4 (L) 6.5 - 8.1 g/dL   Albumin 3.0 (L) 3.5 - 5.0 g/dL   AST 21 15 - 41 U/L   ALT 23 0 - 44 U/L   Alkaline Phosphatase 119 38 - 126 U/L   Total Bilirubin 0.7 0.3 - 1.2 mg/dL   GFR calc non Af Amer >60 >60 mL/min   GFR calc Af Amer >60 >60 mL/min   Anion gap 9 5 - 15    A: 1144w3d week IUP Gestational hypertension FHR reactive  P:  Admit to birthing suites per consult with  Huel Cote, MD.   Judeth Horn, NP 10/10/2017 6:23 PM

## 2017-10-10 NOTE — Progress Notes (Signed)
Patient ID: Lynn May, female   DOB: Jul 07, 1985, 32 y.o.   MRN: [id number] Pt admitted and feeling well, no HA or PIH sx.  Does not really feel her contractions  afeb VSS FHR Category 1 Mild contractions every 5 min  Cervix 70/1+/-2 anterior AROM clear  PCN on board for +GBS Will begin Pitocin and follow progress

## 2017-10-10 NOTE — MAU Note (Signed)
Pt sent from MD office with elevated BP, intermittent slight HA, denies visual changes or epigastric pain.  Bilateral pedal edema noted.  Pt states she is having some mild uc's, denies bleeding or LOF.  Reports good fetal movement.

## 2017-10-10 NOTE — H&P (Signed)
Lynn May is a 32 y.o. female G3P0020  at 7139 3/7 weeks (EDD 10/14/17 by LMP c/w 7 week US) presenting for elevated BP at term. Pt seen in office with BP 150-160/100.  No sx except mild intermittent HA.  No proteinuria,  Otherwise uneventful except +GBS.  OB History    Gravida  3   Para      Term      Preterm      AB  2   Living  0     SAB      TAB  2   Ectopic      Multiple      Live Births            EAB x 2  Past Medical History:  Diagnosis Date  . Pregnancy induced hypertension    Past Surgical History:  Procedure Laterality Date  . WISDOM TOOTH EXTRACTION     Family History: family history includes Hypertension in her father; Stroke in her paternal aunt, paternal grandmother, and paternal uncle. Social History:  reports that she has never smoked. She has never used smokeless tobacco. She reports that she does not drink alcohol or use drugs.     Maternal Diabetes: No Genetic Screening: Normal Maternal Ultrasounds/Referrals: Normal Fetal Ultrasounds or other Referrals:  None Maternal Substance Abuse:  No Significant Maternal Medications:  None Significant Maternal Lab Results:  Lab values include: Group B Strep positive Other Comments:  None  Review of Systems  Eyes: Negative for blurred vision.  Gastrointestinal: Negative for abdominal pain.  Neurological: Positive for headaches (mild intermittent).   Maternal Medical History:  Contractions: Frequency: irregular.   Perceived severity is mild.    Fetal activity: Perceived fetal activity is normal.    Prenatal complications: PIH.   Prenatal Complications - Diabetes: none.      Blood pressure (!) 156/100, pulse 83, temperature 98.5 F (36.9 C), temperature source Oral, resp. rate 18, height 5' 3.5" (1.613 m), weight 96.6 kg (213 lb), last menstrual period 01/07/2017. Maternal Exam:  Uterine Assessment: Contraction strength is mild.  Contraction frequency is irregular.   Abdomen: Patient  reports no abdominal tenderness. Fetal presentation: vertex  Introitus: Normal vulva. Normal vagina.    Physical Exam  Constitutional: She appears well-developed and well-nourished.  Cardiovascular: Normal rate and regular rhythm.  Musculoskeletal: She exhibits edema.  Neurological: She is alert. She displays normal reflexes.  Psychiatric: She has a normal mood and affect.    Prenatal labs: ABO, Rh: O/Positive/-- (01/17 0000) Antibody: Negative (01/17 0000) Rubella: Immune (01/17 0000) RPR: Nonreactive (01/17 0000)  HBsAg: Negative (01/17 0000)  HIV: Non-reactive (01/17 0000)  GBS:   Positive One hour GCT 121  First trimester screen negative  Assessment/Plan: Pt admitted with gestational hypertension, labs pending.  BP occasionally in severe range, so protocol covering. PCN for +GBS and then will assess cervix for pitocin vs cytotec induction. Followup labs but no evidence of preeclampsia.   Oliver PilaKathy W Kadin Canipe 10/10/2017, 6:20 PM

## 2017-10-11 ENCOUNTER — Inpatient Hospital Stay (HOSPITAL_COMMUNITY): Payer: BLUE CROSS/BLUE SHIELD | Admitting: Anesthesiology

## 2017-10-11 ENCOUNTER — Encounter (HOSPITAL_COMMUNITY): Payer: Self-pay | Admitting: Anesthesiology

## 2017-10-11 LAB — CBC
HCT: 32.1 % — ABNORMAL LOW (ref 36.0–46.0)
Hemoglobin: 11.1 g/dL — ABNORMAL LOW (ref 12.0–15.0)
MCH: 30.6 pg (ref 26.0–34.0)
MCHC: 34.6 g/dL (ref 30.0–36.0)
MCV: 88.4 fL (ref 78.0–100.0)
PLATELETS: 219 10*3/uL (ref 150–400)
RBC: 3.63 MIL/uL — ABNORMAL LOW (ref 3.87–5.11)
RDW: 14 % (ref 11.5–15.5)
WBC: 15.8 10*3/uL — ABNORMAL HIGH (ref 4.0–10.5)

## 2017-10-11 LAB — RPR: RPR: NONREACTIVE

## 2017-10-11 MED ORDER — ONDANSETRON HCL 4 MG PO TABS: 4.0000 mg | ORAL_TABLET | ORAL | Status: DC | PRN

## 2017-10-11 MED ORDER — ONDANSETRON HCL 4 MG/2ML IJ SOLN: 4.0000 mg | INTRAMUSCULAR | Status: DC | PRN

## 2017-10-11 MED ORDER — ACETAMINOPHEN 325 MG PO TABS
650.0000 mg | ORAL_TABLET | ORAL | Status: DC | PRN
Start: 2017-10-11 — End: 2017-10-13

## 2017-10-11 MED ORDER — PRENATAL MULTIVITAMIN CH
1.0000 | ORAL_TABLET | Freq: Every day | ORAL | Status: DC
  Administered 2017-10-11 – 2017-10-13 (×3): 1 via ORAL
  Filled 2017-10-11 (×3): qty 1

## 2017-10-11 MED ORDER — ZOLPIDEM TARTRATE 5 MG PO TABS: 5.0000 mg | ORAL_TABLET | Freq: Every evening | ORAL | Status: DC | PRN

## 2017-10-11 MED ORDER — SIMETHICONE 80 MG PO CHEW: 80.0000 mg | CHEWABLE_TABLET | ORAL | Status: DC | PRN

## 2017-10-11 MED ORDER — DIBUCAINE 1 % RE OINT: 1.0000 "application " | TOPICAL_OINTMENT | RECTAL | Status: DC | PRN

## 2017-10-11 MED ORDER — LIDOCAINE HCL (PF) 1 % IJ SOLN
INTRAMUSCULAR | Status: DC | PRN
  Administered 2017-10-11 (×2): 4 mL via EPIDURAL

## 2017-10-11 MED ORDER — LACTATED RINGERS IV SOLN: 500.0000 mL | Freq: Once | INTRAVENOUS | Status: DC

## 2017-10-11 MED ORDER — SENNOSIDES-DOCUSATE SODIUM 8.6-50 MG PO TABS: 2.0000 | ORAL_TABLET | ORAL | Status: DC

## 2017-10-11 MED ORDER — WITCH HAZEL-GLYCERIN EX PADS: 1.0000 "application " | MEDICATED_PAD | CUTANEOUS | Status: DC | PRN

## 2017-10-11 MED ORDER — BENZOCAINE-MENTHOL 20-0.5 % EX AERO: 1.0000 "application " | INHALATION_SPRAY | CUTANEOUS | Status: DC | PRN

## 2017-10-11 MED ORDER — COCONUT OIL OIL: 1.0000 "application " | TOPICAL_OIL | Status: DC | PRN

## 2017-10-11 MED ORDER — LABETALOL HCL 100 MG PO TABS
100.0000 mg | ORAL_TABLET | Freq: Once | ORAL | Status: AC
  Administered 2017-10-11: 100 mg via ORAL
  Filled 2017-10-11: qty 1

## 2017-10-11 MED ORDER — TETANUS-DIPHTH-ACELL PERTUSSIS 5-2.5-18.5 LF-MCG/0.5 IM SUSP: 0.5000 mL | Freq: Once | INTRAMUSCULAR | Status: DC

## 2017-10-11 MED ORDER — IBUPROFEN 600 MG PO TABS
600.0000 mg | ORAL_TABLET | Freq: Four times a day (QID) | ORAL | Status: DC
  Administered 2017-10-11 – 2017-10-13 (×8): 600 mg via ORAL
  Filled 2017-10-11 (×7): qty 1

## 2017-10-11 MED ORDER — DIPHENHYDRAMINE HCL 25 MG PO CAPS: 25.0000 mg | ORAL_CAPSULE | Freq: Four times a day (QID) | ORAL | Status: DC | PRN

## 2017-10-11 NOTE — Anesthesia Preprocedure Evaluation (Signed)
Anesthesia Evaluation  Patient identified by MRN, date of birth, ID band Patient awake    Reviewed: Allergy & Precautions, Patient's Chart, lab work & pertinent test results, reviewed documented beta blocker date and time   Airway Mallampati: II  TM Distance: >3 FB Neck ROM: Full    Dental no notable dental hx. (+) Teeth Intact   Pulmonary neg pulmonary ROS,    Pulmonary exam normal breath sounds clear to auscultation       Cardiovascular hypertension, Pt. on medications and Pt. on home beta blockers Normal cardiovascular exam Rhythm:Regular Rate:Normal  PIH   Neuro/Psych negative neurological ROS  negative psych ROS   GI/Hepatic negative GI ROS, Neg liver ROS,   Endo/Other  Morbid obesity  Renal/GU negative Renal ROS  negative genitourinary   Musculoskeletal negative musculoskeletal ROS (+)   Abdominal (+) + obese,   Peds  Hematology  (+) anemia ,   Anesthesia Other Findings   Reproductive/Obstetrics (+) Pregnancy                             Anesthesia Physical Anesthesia Plan  ASA: III  Anesthesia Plan: Epidural   Post-op Pain Management:    Induction:   PONV Risk Score and Plan:   Airway Management Planned: Natural Airway  Additional Equipment:   Intra-op Plan:   Post-operative Plan:   Informed Consent: I have reviewed the patients History and Physical, chart, labs and discussed the procedure including the risks, benefits and alternatives for the proposed anesthesia with the patient or authorized representative who has indicated his/her understanding and acceptance.     Plan Discussed with: Anesthesiologist  Anesthesia Plan Comments:         Anesthesia Quick Evaluation

## 2017-10-11 NOTE — Anesthesia Procedure Notes (Signed)
Epidural Patient location during procedure: OB Start time: 10/11/2017 3:13 AM  Staffing Anesthesiologist: Mal AmabileFoster, Zofia Peckinpaugh, MD Performed: anesthesiologist   Preanesthetic Checklist Completed: patient identified, site marked, surgical consent, pre-op evaluation, timeout performed, IV checked, risks and benefits discussed and monitors and equipment checked  Epidural Patient position: sitting Prep: site prepped and draped and DuraPrep Patient monitoring: continuous pulse ox and blood pressure Approach: midline Location: L3-L4 Injection technique: LOR air  Needle:  Needle type: Tuohy  Needle gauge: 17 G Needle length: 9 cm and 9 Needle insertion depth: 5 cm cm Catheter type: closed end flexible Catheter size: 19 Gauge Catheter at skin depth: 10 cm Test dose: negative and Other  Assessment Events: blood not aspirated, injection not painful, no injection resistance, negative IV test and no paresthesia  Additional Notes Patient identified. Risks and benefits discussed including failed block, incomplete  Pain control, post dural puncture headache, nerve damage, paralysis, blood pressure Changes, nausea, vomiting, reactions to medications-both toxic and allergic and post Partum back pain. All questions were answered. Patient expressed understanding and wished to proceed. Sterile technique was used throughout procedure. Epidural site was Dressed with sterile barrier dressing. No paresthesias, signs of intravascular injection Or signs of intrathecal spread were encountered.  Patient was more comfortable after the epidural was dosed. Please see RN's note for documentation of vital signs and FHR which are stable.

## 2017-10-11 NOTE — Lactation Note (Signed)
This note was copied from a baby's chart. Lactation Consultation Note  Patient Name: Lynn May WUJWJ'XToday's Date: 10/11/2017 Reason for consult: Initial assessment;Term;1st time breastfeeding;Primapara;Infant < 6lbs;Other (Comment)(SGA infant)  5 hours old FT female who is being exclusively BF by her mother, she's a P1. [facility] and she already knows how to hand express, [facility] with hand expression mom was able to get one droplet of colostrum.  She has a Medela DEBP at home.  Offered assistance with latch, both parents agreed to arouse baby, he was waking up and showing feeding cues. LC took baby to mom's left breast STS but baby not able to latch at this point. When LC did suck training baby unable to suck consistently on a finger; it felt more like chomping/bitting, very little sucking noted. LC transition back to the breast doing the teacup hold, baby latched very briefly for a few seconds before self releasing from the breast and falling asleep. Asked mom to call for assistance the next time baby is ready to feed.  Both breast look swollen upon examination, mom had GHTN and she's got areola edema on both breasts, areola area looks very swollen and hard to the touch; and tissue is non compressible. Asked mom how does she feel about pumping and she said she's willing to do whatever needs to be done to feed her baby. Set mom up with a DEBP, reviewed pump instructions, cleaning and storage. Asked her RN for coconut oil and instructed mom to use it prior pumping. Mom will be pumping every 3 hours and at least once at night. Mom brought a nursing bra to the hospital, she has also been given shells to wear during the daytime, she'll start wearing those today in between feedings.  Encouraged mom to feed baby 8-12 times/24 hours or sooner if feeding cues are present. If baby is not cueing in a 3 hour period she'll wake her up to feed. Mom will finger or spoon feed  baby any amount of EBM she may get. Mom is aware that since this is a baby < 6 lbs supplementation may be needed at some point if unable to complete the volumes required for baby's age through pumping. Formula of choice will be Similac 22 calorie.  Reviewed BF brochure, BF resources and feeding diary, both parents are aware of LC services and will call PRN.  Maternal Data Formula Feeding for Exclusion: No Has patient been taught Hand Expression?: Yes Does the patient have breastfeeding experience prior to this delivery?: No  Feeding Feeding Type: Breast Fed    Interventions Interventions: Breast feeding basics reviewed;Assisted with latch;Skin to skin;Breast massage;Hand express;Breast compression;Adjust position;DEBP;Reverse pressure;Shells;Coconut oil;Support pillows  Lactation Tools Discussed/Used Tools: Shells;Pump;Coconut oil Shell Type: Inverted Breast pump type: Double-Electric Breast Pump WIC Program: No Pump Review: Setup, frequency, and cleaning Initiated by:: [name] Date initiated:: 10/11/17   Consult Status Consult Status: Follow-up Date: 10/12/17 Follow-up type: In-patient    Lynn May 10/11/2017, 3:07 PM

## 2017-10-12 LAB — CBC
HCT: 28.7 % — ABNORMAL LOW (ref 36.0–46.0)
HEMOGLOBIN: 10 g/dL — AB (ref 12.0–15.0)
MCH: 30.9 pg (ref 26.0–34.0)
MCHC: 34.8 g/dL (ref 30.0–36.0)
MCV: 88.6 fL (ref 78.0–100.0)
Platelets: 193 10*3/uL (ref 150–400)
RBC: 3.24 MIL/uL — ABNORMAL LOW (ref 3.87–5.11)
RDW: 14.2 % (ref 11.5–15.5)
WBC: 10.9 10*3/uL — ABNORMAL HIGH (ref 4.0–10.5)

## 2017-10-12 MED ORDER — LABETALOL HCL 100 MG PO TABS
100.0000 mg | ORAL_TABLET | Freq: Two times a day (BID) | ORAL | Status: DC
  Administered 2017-10-12 – 2017-10-13 (×3): 100 mg via ORAL
  Filled 2017-10-12 (×3): qty 1

## 2017-10-12 NOTE — Progress Notes (Signed)
Post Partum Day 1 Subjective: no complaints, up ad lib and tolerating PO  Objective: Blood pressure 138/80, pulse 85, temperature 98.2 F (36.8 C), temperature source Oral, resp. rate 18, height 5' 3.5" (1.613 m), weight 96.6 kg (213 lb), last menstrual period 01/07/2017, unknown if currently breastfeeding.  Physical Exam:  General: alert and cooperative Lochia: appropriate Uterine Fundus: firm   Recent Labs    10/11/17 1021 10/12/17 0618  HGB 11.1* 10.0*  HCT 32.1* 28.7*    Assessment/Plan: Plan for discharge tomorrow  D/w parents circumcision and they are aware need to pay today to have done in AM   LOS: 2 days   Oliver PilaKathy W Stratton Villwock 10/12/2017, 10:38 AM

## 2017-10-12 NOTE — Anesthesia Postprocedure Evaluation (Signed)
Anesthesia Post Note  Patient: Lynn BorgEleisha May  Procedure(s) Performed: AN AD HOC LABOR EPIDURAL     Patient location during evaluation: Mother Baby Anesthesia Type: Epidural Level of consciousness: awake and alert Pain management: pain level controlled Vital Signs Assessment: post-procedure vital signs reviewed and stable Respiratory status: spontaneous breathing, nonlabored ventilation and respiratory function stable Cardiovascular status: stable Postop Assessment: no headache, no backache, epidural receding and patient able to bend at knees Anesthetic complications: no    Last Vitals:  Vitals:   10/11/17 2240 10/12/17 0240  BP: (!) 147/82 138/80  Pulse: 88 85  Resp: 18 18  Temp: 37.5 C 36.8 C    Last Pain:  Vitals:   10/12/17 0536  TempSrc:   PainSc: 0-No pain   Pain Goal:                 [name],[name]

## 2017-10-13 MED ORDER — LABETALOL HCL 100 MG PO TABS: 100.0000 mg | ORAL_TABLET | Freq: Two times a day (BID) | ORAL | 0 refills | Status: DC

## 2017-10-13 MED ORDER — IBUPROFEN 600 MG PO TABS: 600.0000 mg | ORAL_TABLET | Freq: Four times a day (QID) | ORAL | 0 refills | Status: AC

## 2017-10-13 NOTE — Lactation Note (Signed)
This note was copied from a baby's chart. Lactation Consultation Note  Patient Name: Lynn May WUJWJ'XToday's Date: 10/13/2017 Reason for consult: Follow-up assessment;1st time breastfeeding;Primapara;Infant < 6lbs;Difficult latch  Baby is 6551 hours old  LC reviewed and updated the doc flow sheets  Per mom areolas are still swollen and improving with shells.  Pumping with the DEBP every 3 hours, Also has a DEBP at home.  LC instructed mom on the use of hand pump for pre-pumping if needed.  LC offered mom and LC O/P appt. , [place] and plans to check with  [facility].  If not to [facility] and make appt.  Reviewed supply and demand and  The importance of protecting establishing  Milk supply .  Sore  Nipple and engorgement prevention and tx .  Mother informed of post-discharge support and given phone number to the lactation department, including services for phone call assistance; out-patient appointments; and breastfeeding support group. List of other breastfeeding resources in the community given in the handout. Encouraged mother to call for problems or concerns related to breastfeeding.    Maternal Data Has patient been taught Hand Expression?: Yes(per mom feels comfortable )  Feeding Feeding Type: (last fed at 1215 for 26ml )  LATCH Score                   Interventions Interventions: Breast feeding basics reviewed  Lactation Tools Discussed/Used Pump Review: Milk Storage   Consult Status Consult Status: Follow-up Date: (since [place] in [place] will [facility] ) Follow-up type: Out-patient    Lynn May 10/13/2017, 1:08 PM

## 2017-10-13 NOTE — Discharge Summary (Signed)
OB Discharge Summary     Patient Name: Lynn May DOB: Nov 02, 1985 MRN: [id number]  Date of admission: 10/10/2017 Delivering MD: Lynn May   Date of discharge: 10/13/2017  Admitting diagnosis: 39.3WKS HBP Intrauterine pregnancy: [redacted]w[redacted]d     Secondary diagnosis:  Active Problems:   Gestational hypertension without significant proteinuria during pregnancy in third trimester, antepartum   NSVD (normal spontaneous vaginal delivery)      Discharge diagnosis: Term Pregnancy Delivered and Gestational Hypertension                                   Hospital course:  Induction of Labor With Vaginal Delivery   32 y.o. yo [id number] at [redacted]w[redacted]d was admitted to the hospital 10/10/2017 for induction of labor.  Indication for induction: Gestational hypertension.  Patient had an uncomplicated labor course as follows: Membrane Rupture Time/Date: 9:12 PM ,10/10/2017   Intrapartum Procedures: Episiotomy: None [1]                                         Lacerations:  Periurethral [8]  Patient had delivery of a Viable infant.  Information for the patient's newborn:  Lynn, May [[id number]]  Delivery Method: Vag-Spont   10/11/2017  Details of delivery can be found in separate delivery note.  Patient had a routine postpartum course, continued on Labetalol 100 mg po bid with good BP control. Patient is discharged home 10/13/17.  Physical exam  Vitals:   10/12/17 0240 10/12/17 1440 10/12/17 2100 10/13/17 0518  BP: 138/80 (!) 142/73 (!) 146/95 (!) 145/89  Pulse: 85 82 90 94  Resp: 18  18 18   Temp: 98.2 F (36.8 C) 98.5 F (36.9 C) 98.4 F (36.9 C) 98.8 F (37.1 C)  TempSrc: Oral Oral Oral Oral  SpO2:  98% 100%   Weight:      Height:       General: alert Lochia: appropriate Uterine Fundus: firm  Labs: Lab Results  Component Value Date   WBC 10.9 (H) 10/12/2017   HGB 10.0 (L) 10/12/2017   HCT 28.7 (L) 10/12/2017   MCV 88.6 10/12/2017   PLT 193 10/12/2017   CMP Latest Ref Rng  & Units 10/10/2017  Glucose 70 - 99 mg/dL 82  BUN 6 - 20 mg/dL 10  Creatinine 1.30 - 8.65 mg/dL 7.84  Sodium 696 - 295 mmol/L 134(L)  Potassium 3.5 - 5.1 mmol/L 4.7  Chloride 98 - 111 mmol/L 104  CO2 22 - 32 mmol/L 21(L)  Calcium 8.9 - 10.3 mg/dL 9.5  Total Protein 6.5 - 8.1 g/dL 6.4(L)  Total Bilirubin 0.3 - 1.2 mg/dL 0.7  Alkaline Phos 38 - 126 U/L 119  AST 15 - 41 U/L 21  ALT 0 - 44 U/L 23    Discharge instruction: per After Visit Summary and "Baby and Me Booklet".  After visit meds:  Allergies as of 10/13/2017      Reactions   Asa [aspirin] Swelling   Eyes      Medication List    TAKE these medications   ibuprofen 600 MG tablet Commonly known as:  ADVIL,MOTRIN Take 1 tablet (600 mg total) by mouth every 6 (six) hours.   labetalol 100 MG tablet Commonly known as:  NORMODYNE Take 1 tablet (100 mg total) by mouth 2 (two) times daily. What changed:  when to take this   PRENATAL VITAMIN PO Take by mouth.       Diet: routine diet  Activity: Advance as tolerated. Pelvic rest for 6 weeks.   Outpatient follow up:3 days for BP check  Newborn Data: Live born female  Birth Weight: 5 lb 9.4 oz (2535 g) APGAR: 9, 9  Newborn Delivery   Birth date/time:  10/11/2017 09:25:00 Delivery type:  Vaginal, Spontaneous     Baby Feeding: Breast Disposition:home with mother   10/13/2017 Lynn Nieceodd D Taiana Temkin, MD

## 2017-10-13 NOTE — Progress Notes (Signed)
PPD #2 Doing well Afeb, VSS, BP 140/70-90 D/c home on low dose labetalol

## 2017-10-13 NOTE — Discharge Instructions (Signed)
As per discharge pamphlet °

## 2017-10-16 ENCOUNTER — Observation Stay (HOSPITAL_COMMUNITY)
Admission: AD | Admit: 2017-10-16 | Discharge: 2017-10-18 | Disposition: A | Discharge status: Home / Self Care | Payer: BLUE CROSS/BLUE SHIELD | Source: Ambulatory Visit | Attending: Obstetrics and Gynecology | Admitting: Obstetrics and Gynecology

## 2017-10-16 ENCOUNTER — Other Ambulatory Visit: Payer: Self-pay

## 2017-10-16 ENCOUNTER — Encounter (HOSPITAL_COMMUNITY): Payer: Self-pay | Admitting: *Deleted

## 2017-10-16 DIAGNOSIS — O135 Gestational [pregnancy-induced] hypertension without significant proteinuria, complicating the puerperium: Principal | ICD-10-CM | POA: Insufficient documentation

## 2017-10-16 DIAGNOSIS — O1495 Unspecified pre-eclampsia, complicating the puerperium: Secondary | ICD-10-CM

## 2017-10-16 DIAGNOSIS — Z79899 Other long term (current) drug therapy: Secondary | ICD-10-CM | POA: Insufficient documentation

## 2017-10-16 DIAGNOSIS — R51 Headache: Secondary | ICD-10-CM | POA: Diagnosis present

## 2017-10-16 LAB — PROTEIN / CREATININE RATIO, URINE
CREATININE, URINE: 40 mg/dL
Total Protein, Urine: <6 mg/dL

## 2017-10-16 LAB — CBC
HCT: 31.8 % — ABNORMAL LOW (ref 36.0–46.0)
Hemoglobin: 11.1 g/dL — ABNORMAL LOW (ref 12.0–15.0)
MCH: 30.9 pg (ref 26.0–34.0)
MCHC: 34.9 g/dL (ref 30.0–36.0)
MCV: 88.6 fL (ref 78.0–100.0)
PLATELETS: 260 10*3/uL (ref 150–400)
RBC: 3.59 MIL/uL — ABNORMAL LOW (ref 3.87–5.11)
RDW: 14.1 % (ref 11.5–15.5)
WBC: 8.5 10*3/uL (ref 4.0–10.5)

## 2017-10-16 LAB — COMPREHENSIVE METABOLIC PANEL
ALT: 50 U/L — AB (ref 0–44)
ANION GAP: 9 (ref 5–15)
AST: 43 U/L — ABNORMAL HIGH (ref 15–41)
Albumin: 3.3 g/dL — ABNORMAL LOW (ref 3.5–5.0)
Alkaline Phosphatase: 98 U/L (ref 38–126)
BUN: 12 mg/dL (ref 6–20)
CHLORIDE: 105 mmol/L (ref 98–111)
CO2: 20 mmol/L — ABNORMAL LOW (ref 22–32)
CREATININE: 1.1 mg/dL — AB (ref 0.44–1.00)
Calcium: 8.8 mg/dL — ABNORMAL LOW (ref 8.9–10.3)
GFR calc Af Amer: >60 mL/min (ref >60)
Glucose, Bld: 72 mg/dL (ref 70–99)
POTASSIUM: 3.9 mmol/L (ref 3.5–5.1)
Sodium: 134 mmol/L — ABNORMAL LOW (ref 135–145)
Total Bilirubin: 0.9 mg/dL (ref 0.3–1.2)
Total Protein: 6.6 g/dL (ref 6.5–8.1)

## 2017-10-16 MED ORDER — SODIUM CHLORIDE 0.9% FLUSH: 3.0000 mL | INTRAVENOUS | Status: DC | PRN

## 2017-10-16 MED ORDER — HYDRALAZINE HCL 20 MG/ML IJ SOLN
5.0000 mg | INTRAMUSCULAR | Status: AC | PRN
  Administered 2017-10-16: 5 mg via INTRAVENOUS
  Administered 2017-10-16: 10 mg via INTRAVENOUS
  Filled 2017-10-16 (×2): qty 1

## 2017-10-16 MED ORDER — LABETALOL HCL 5 MG/ML IV SOLN
20.0000 mg | INTRAVENOUS | Status: DC | PRN
  Administered 2017-10-16: 20 mg via INTRAVENOUS
  Filled 2017-10-16: qty 4

## 2017-10-16 MED ORDER — SODIUM CHLORIDE 0.9 % IV SOLN: 250.0000 mL | INTRAVENOUS | Status: DC | PRN

## 2017-10-16 MED ORDER — AMLODIPINE BESYLATE 10 MG PO TABS
10.0000 mg | ORAL_TABLET | Freq: Every day | ORAL | Status: DC
  Administered 2017-10-18: 10 mg via ORAL
  Filled 2017-10-16 (×2): qty 1

## 2017-10-16 MED ORDER — LACTATED RINGERS IV SOLN: INTRAVENOUS | Status: DC | PRN

## 2017-10-16 MED ORDER — PRENATAL MULTIVITAMIN CH
1.0000 | ORAL_TABLET | Freq: Every day | ORAL | Status: DC
  Administered 2017-10-17 – 2017-10-18 (×2): 1 via ORAL
  Filled 2017-10-16 (×2): qty 1

## 2017-10-16 MED ORDER — AMLODIPINE BESYLATE 10 MG PO TABS
10.0000 mg | ORAL_TABLET | Freq: Once | ORAL | Status: AC
  Administered 2017-10-17: 10 mg via ORAL
  Filled 2017-10-16: qty 1

## 2017-10-16 MED ORDER — ACETAMINOPHEN 500 MG PO TABS
1000.0000 mg | ORAL_TABLET | Freq: Once | ORAL | Status: AC
  Administered 2017-10-16: 1000 mg via ORAL
  Filled 2017-10-16: qty 2

## 2017-10-16 MED ORDER — IBUPROFEN 600 MG PO TABS
600.0000 mg | ORAL_TABLET | ORAL | Status: DC | PRN
Start: 2017-10-16 — End: 2017-10-18
  Administered 2017-10-16 – 2017-10-18 (×3): 600 mg via ORAL
  Filled 2017-10-16 (×3): qty 1

## 2017-10-16 MED ORDER — SODIUM CHLORIDE 0.9% FLUSH
3.0000 mL | Freq: Two times a day (BID) | INTRAVENOUS | Status: DC
  Administered 2017-10-16 – 2017-10-18 (×4): 3 mL via INTRAVENOUS

## 2017-10-16 NOTE — MAU Provider Note (Signed)
Chief Complaint  Patient presents with  . Hypertension     First Provider Initiated Contact with Patient 10/16/17 1532      S: Lynn May  is a 32 y.o. y.o. year old G50P1021 female, 5 days s/p SVD, who presents to MAU with elevated blood pressures & headache. Patient with gestational hypertension during this pregnancy. Did not receive magnesium during labor. Current blood pressure medication: labetalol 100 mg BID; last took dose this morning around 9 am.  Reports daily headache since going home. Has been taking ibuprofen without relief. Last took ibuprofen at midnight. Nothing makes headache better or worse. Went to office today for BP check and it was 190s/110s.   Associated symptoms: + Headache, No vision changes, No epigastric pain   O:  Patient Vitals for the past 24 hrs:  BP Temp Pulse Resp Height Weight  10/16/17 1646 (!) 183/98 - 87 - - -  10/16/17 1631 (!) 181/95 - 83 - - -  10/16/17 1606 (!) 167/102 - 74 - - -  10/16/17 1601 (!) 181/95 - 86 - - -  10/16/17 1549 (!) 158/102 - 80 - - -  10/16/17 1516 (!) 163/99 - 82 - - -  10/16/17 1515 (!) 163/99 98.6 F (37 C) 82 18 - -  10/16/17 1506 - - - - 5' 3.5" (1.613 m) 196 lb (88.9 kg)   General: NAD Heart: Regular rate Lungs: Normal rate and effort Abd: Soft, NT, Gravid, S=D Extremities: 3+ bilateral Pedal edema Neuro: 2+ deep tendon reflexes, No clonus Pelvic: NEFG, no bleeding or LOF.       Results for orders placed or performed during the hospital encounter of 10/16/17 (from the past 24 hour(s))  CBC     Status: Abnormal   Collection Time: 10/16/17  3:25 PM  Result Value Ref Range   WBC 8.5 4.0 - 10.5 K/uL   RBC 3.59 (L) 3.87 - 5.11 MIL/uL   Hemoglobin 11.1 (L) 12.0 - 15.0 g/dL   HCT 96.0 (L) 45.4 - 09.8 %   MCV 88.6 78.0 - 100.0 fL   MCH 30.9 26.0 - 34.0 pg   MCHC 34.9 30.0 - 36.0 g/dL   RDW 11.9 14.7 - 82.9 %   Platelets 260 150 - 400 K/uL  Comprehensive metabolic panel     Status: Abnormal   Collection  Time: 10/16/17  3:25 PM  Result Value Ref Range   Sodium 134 (L) 135 - 145 mmol/L   Potassium 3.9 3.5 - 5.1 mmol/L   Chloride 105 98 - 111 mmol/L   CO2 20 (L) 22 - 32 mmol/L   Glucose, Bld 72 70 - 99 mg/dL   BUN 12 6 - 20 mg/dL   Creatinine, Ser 5.62 (H) 0.44 - 1.00 mg/dL   Calcium 8.8 (L) 8.9 - 10.3 mg/dL   Total Protein 6.6 6.5 - 8.1 g/dL   Albumin 3.3 (L) 3.5 - 5.0 g/dL   AST 43 (H) 15 - 41 U/L   ALT 50 (H) 0 - 44 U/L   Alkaline Phosphatase 98 38 - 126 U/L   Total Bilirubin 0.9 0.3 - 1.2 mg/dL   GFR calc non Af Amer >60 >60 mL/min   GFR calc Af Amer >60 >60 mL/min   Anion gap 9 5 - 15  Protein / creatinine ratio, urine     Status: None   Collection Time: 10/16/17  3:46 PM  Result Value Ref Range   Creatinine, Urine 40.00 mg/dL   Total Protein, Urine <  6 mg/dL   Protein Creatinine Ratio        0.00 - 0.15 mg/mg[Cre]    A: 1. Preeclampsia in postpartum period   -TX in MAU includes IV hydralazine & tylenol PO  P:  Admit for observation per consult with Huel Coteichardson, Kathy, MD. Start norvasc 10 gm QD, first dose now Continue IV antihypertensive protocol for severe range BPs   Judeth HornLawrence, Cashton Hosley, NP 10/16/2017 3:38 PM

## 2017-10-16 NOTE — H&P (Signed)
Lynn May is a 32 y.o. female G3P1021 s/p NSVD 10/11/17  at 39 3/7 weeks after IOL for mild gestational hypertension.  BP during labor and postpartum were not significantly elevated and on PPD #2 running 130-140/70-90 so d/c on low dose labetalol.  Began to have HA and was scheduled to f/u in office for BP check today where BP in severe range 180-190/105-110 so sent to MAU.  In MAU, Bp responded to 2 doses of hydralazine and norvasc po started 2 hours ago.  She still c/o mild HA, no vision changes.  Labs significant for slight bump in LFT's to 43/50 and creatinine at 1.1.  Platelets and urine protein WNL. She is breastfeeding  Pertinent Gynecological History: OB History: NSVD x 1 10/11/17   EAB x 2  Menstrual History:  No LMP recorded.    Past Medical History:  Diagnosis Date  . Pregnancy induced hypertension     Past Surgical History:  Procedure Laterality Date  . WISDOM TOOTH EXTRACTION      Family History  Problem Relation Age of Onset  . Hypertension Father   . Stroke Paternal Aunt   . Stroke Paternal Uncle   . Stroke Paternal Grandmother     Social History:  reports that she has never smoked. She has never used smokeless tobacco. She reports that she does not drink alcohol or use drugs.  Allergies:  Allergies  Allergen Reactions  . Asa [Aspirin] Swelling    Eyes    Medications Prior to Admission  Medication Sig Dispense Refill Last Dose  . ibuprofen (ADVIL,MOTRIN) 600 MG tablet Take 1 tablet (600 mg total) by mouth every 6 (six) hours. 30 tablet 0 10/16/2017 at Unknown time  . labetalol (NORMODYNE) 100 MG tablet Take 1 tablet (100 mg total) by mouth 2 (two) times daily. 1 tablet 0 10/16/2017 at 1000  . Prenatal Vit-Fe Fumarate-FA (PRENATAL VITAMIN PO) Take 1 tablet by mouth daily.    10/15/2017 at Unknown time    Review of Systems  Eyes: Negative for blurred vision and double vision.  Cardiovascular: Negative for chest pain.  Neurological: Positive for headaches.     Blood pressure (!) 149/88, pulse 92, temperature 98.5 F (36.9 C), temperature source Oral, resp. rate 16, height 5' 3.5" (1.613 m), weight 88.9 kg (196 lb), SpO2 100 %, unknown if currently breastfeeding. Physical Exam  Constitutional: She appears well-developed.  Cardiovascular: Normal rate and regular rhythm.  Respiratory: Effort normal.  GI: Soft.  Genitourinary: Vagina normal.  Musculoskeletal: Normal range of motion.  Neurological: She is alert.  Psychiatric: She has a normal mood and affect.    Results for orders placed or performed during the hospital encounter of 10/16/17 (from the past 24 hour(s))  CBC     Status: Abnormal   Collection Time: 10/16/17  3:25 PM  Result Value Ref Range   WBC 8.5 4.0 - 10.5 K/uL   RBC 3.59 (L) 3.87 - 5.11 MIL/uL   Hemoglobin 11.1 (L) 12.0 - 15.0 g/dL   HCT 40.9 (L) 81.1 - 91.4 %   MCV 88.6 78.0 - 100.0 fL   MCH 30.9 26.0 - 34.0 pg   MCHC 34.9 30.0 - 36.0 g/dL   RDW 78.2 95.6 - 21.3 %   Platelets 260 150 - 400 K/uL  Comprehensive metabolic panel     Status: Abnormal   Collection Time: 10/16/17  3:25 PM  Result Value Ref Range   Sodium 134 (L) 135 - 145 mmol/L   Potassium 3.9 3.5 -  5.1 mmol/L   Chloride 105 98 - 111 mmol/L   CO2 20 (L) 22 - 32 mmol/L   Glucose, Bld 72 70 - 99 mg/dL   BUN 12 6 - 20 mg/dL   Creatinine, Ser 4.091.10 (H) 0.44 - 1.00 mg/dL   Calcium 8.8 (L) 8.9 - 10.3 mg/dL   Total Protein 6.6 6.5 - 8.1 g/dL   Albumin 3.3 (L) 3.5 - 5.0 g/dL   AST 43 (H) 15 - 41 U/L   ALT 50 (H) 0 - 44 U/L   Alkaline Phosphatase 98 38 - 126 U/L   Total Bilirubin 0.9 0.3 - 1.2 mg/dL   GFR calc non Af Amer >60 >60 mL/min   GFR calc Af Amer >60 >60 mL/min   Anion gap 9 5 - 15  Protein / creatinine ratio, urine     Status: None   Collection Time: 10/16/17  3:46 PM  Result Value Ref Range   Creatinine, Urine 40.00 mg/dL   Total Protein, Urine <6 mg/dL   Protein Creatinine Ratio        0.00 - 0.15 mg/mg[Cre]    No results  found.  Assessment/Plan: Pt with postpartum BP exacerbation, possible preeclampsia.  Will give ibuprofen 600mg  po for HA and see if responds now that BP normalizing.  D/w pt in detail.  If HA persists or BP difficult to control, will begin magnesium.  Repeat labs in AM.  Begin I's and O's  Oliver PilaKathy W Azharia Surratt 10/16/2017, 7:08 PM

## 2017-10-16 NOTE — MAU Note (Signed)
Urine sent to lab 

## 2017-10-16 NOTE — MAU Note (Signed)
Post Partum 10/11/2017. Had elevated pressures at end of pregnancy. On Labetalol 100mg  BID. Reports mild headache no relief with motrin.

## 2017-10-17 LAB — COMPREHENSIVE METABOLIC PANEL
ALBUMIN: 3 g/dL — AB (ref 3.5–5.0)
ALK PHOS: 89 U/L (ref 38–126)
ALT: 43 U/L (ref 0–44)
AST: 34 U/L (ref 15–41)
Anion gap: 10 (ref 5–15)
BUN: 13 mg/dL (ref 6–20)
CALCIUM: 8.7 mg/dL — AB (ref 8.9–10.3)
CHLORIDE: 107 mmol/L (ref 98–111)
CO2: 19 mmol/L — AB (ref 22–32)
CREATININE: 1.15 mg/dL — AB (ref 0.44–1.00)
GFR calc non Af Amer: >60 mL/min (ref >60)
GLUCOSE: 78 mg/dL (ref 70–99)
Potassium: 3.8 mmol/L (ref 3.5–5.1)
SODIUM: 136 mmol/L (ref 135–145)
Total Bilirubin: 1 mg/dL (ref 0.3–1.2)
Total Protein: 5.8 g/dL — ABNORMAL LOW (ref 6.5–8.1)

## 2017-10-17 LAB — CBC
HCT: 29.7 % — ABNORMAL LOW (ref 36.0–46.0)
HEMOGLOBIN: 10.6 g/dL — AB (ref 12.0–15.0)
MCH: 31.6 pg (ref 26.0–34.0)
MCHC: 35.7 g/dL (ref 30.0–36.0)
MCV: 88.7 fL (ref 78.0–100.0)
PLATELETS: 264 10*3/uL (ref 150–400)
RBC: 3.35 MIL/uL — ABNORMAL LOW (ref 3.87–5.11)
RDW: 14.4 % (ref 11.5–15.5)
WBC: 7.9 10*3/uL (ref 4.0–10.5)

## 2017-10-17 MED ORDER — HYDROCHLOROTHIAZIDE 12.5 MG PO CAPS
12.5000 mg | ORAL_CAPSULE | Freq: Once | ORAL | Status: AC
  Administered 2017-10-17: 12.5 mg via ORAL
  Filled 2017-10-17: qty 1

## 2017-10-17 MED ORDER — NIFEDIPINE ER OSMOTIC RELEASE 30 MG PO TB24
30.0000 mg | ORAL_TABLET | Freq: Once | ORAL | Status: AC
  Administered 2017-10-17: 30 mg via ORAL
  Filled 2017-10-17: qty 1

## 2017-10-17 NOTE — Progress Notes (Signed)
Patient ID: Lynn May, female   DOB: 1985-04-08, 32 y.o.   MRN: [id number] Pt doing well. Reports feels "much better" . Denies any HA, blurry vision or CP. Ambulating and tolerating diet. Bonding well with baby, diuresing well.  VSS - 151-156/85-90, UOP - 600cc since midnight ABD - FF EXT - no edema  A/P: HD#2 s/p readmission for severe range BP on PPD#6 now:  BP stable and responding well to norvasc; just received dose for today          Will monitor BPs and if stay stable ok to discharge to home on norvasc          If BP unstable discussed starting Mag sulfate

## 2017-10-17 NOTE — Progress Notes (Signed)
Patient ID: Lynn May, female   DOB: [birth year], 32 y.o.   MRN: [id number] Pt received dose of HCTZ at 1721 and appears to be responding well to this BPP now 155/87 Denies HA or blurry vision Will monitor BPs q 3 overnight while awake. If stay stable will discharge to home in am otherwise will consider adding CCB ( procardia) Routine pp care

## 2017-10-17 NOTE — Progress Notes (Signed)
Patient ID: Lynn May, female   DOB: [birth year], 32 y.o.   MRN: [id number] DBP 97 at last check; SBP stable.  Will continue to monitor Next BP at 1500

## 2017-10-17 NOTE — Progress Notes (Signed)
Patient states that headache has completely gone away. Her blood pressures have been in 140/150 over 90's during the night. The patient has had good urine output and is pumping.

## 2017-10-17 NOTE — Progress Notes (Signed)
   10/17/17 1510 10/17/17 1528 10/17/17 1545  Vital Signs  BP (!) 150/98 (!) 163/89 (!) 156/93 (Simultaneous filing. User may not have seen previous data.)    10/17/17 1600  Vital Signs  BP (!) 155/90  Above Bps reported to Dr Mindi SlickerBanga to include pt nl PreE assessment.  Orders rec'd & MD states she will come to unit to evaluate pt soon.

## 2017-10-18 MED ORDER — NIFEDIPINE ER OSMOTIC RELEASE 30 MG PO TB24
30.0000 mg | ORAL_TABLET | Freq: Two times a day (BID) | ORAL | Status: DC
  Administered 2017-10-18: 30 mg via ORAL
  Filled 2017-10-18: qty 1

## 2017-10-18 MED ORDER — NIFEDIPINE ER 30 MG PO TB24: ORAL_TABLET | ORAL | 1 refills | Status: AC

## 2017-10-18 NOTE — Progress Notes (Signed)
Patient ID: Lynn May, female   DOB: 05/03/1985, 32 y.o.   MRN: [id number] Pt received procardia 30xl at 2120pm after getting HCTZ 12.5 mg at 1721pm BP was 149/99 30mins ago and is now 126/88  Plan: Continue with monitoring

## 2017-10-18 NOTE — Progress Notes (Signed)
Discharged home, ambulatory, in stable condition. 

## 2017-10-18 NOTE — Discharge Summary (Signed)
Physician Discharge Summary  Patient ID: Lynn May MRN: [id number] DOB/AGE: 1985/12/10 32 y.o.  Admit date: 10/16/2017 Discharge date: 10/18/2017  Admission Diagnoses:  Discharge Diagnoses:  Active Problems:   Preeclampsia in postpartum period   Discharged Condition: stable  Hospital Course: Pt admitted for management of severe range BP postpartum. She was started on norvasc ( had been on labetalol) and then HCTZZ. She was subsequently transitioned to procardia with great results. Pt diuresed well and had no concurrent severe range symptoms during hospitalization; labs trended into normal range. Pt discharged to home on HD 3 in stable condition and with close follow up recommended  Consults: None  Significant Diagnostic Studies: labs:   Treatments: amlodipine, nifedipine and HCTZ   Discharge Exam: Blood pressure (!) 147/81, pulse 92, temperature 98.3 F (36.8 C), temperature source Oral, resp. rate 18, height 5' 3.5" (1.613 m), weight 196 lb (88.9 kg), SpO2 98 %, unknown if currently breastfeeding. General appearance: alert, cooperative and no distress  Disposition: Discharge disposition: 01-Home or Self Care       Discharge Instructions    Call MD for:  persistant dizziness or light-headedness   Complete by:  As directed    Call MD for:  persistant nausea and vomiting   Complete by:  As directed    Call MD for:  severe uncontrolled pain   Complete by:  As directed    Call MD for:  temperature >100.4   Complete by:  As directed    Diet - low sodium heart healthy   Complete by:  As directed    Discharge instructions   Complete by:  As directed    Call if any concerns   Increase activity slowly   Complete by:  As directed      Allergies as of 10/18/2017      Reactions   Asa [aspirin] Swelling   Eyes      Medication List    STOP taking these medications   labetalol 100 MG tablet Commonly known as:  NORMODYNE     TAKE these medications   ibuprofen 600  MG tablet Commonly known as:  ADVIL,MOTRIN Take 1 tablet (600 mg total) by mouth every 6 (six) hours.   NIFEdipine 30 MG 24 hr tablet Commonly known as:  PROCARDIA-XL/ADALAT CC Take at 10am and 5pm daily   PRENATAL VITAMIN PO Take 1 tablet by mouth daily.      Follow-up Information    Edwinna Areola,  [place], DO. Call.   Specialty:  Obstetrics and Gynecology Why:  BP check up on 7/29 or 7/30 Keep all already scheduled postpartum appointments Contact information: [address] [place] [address] [phone]           Signed: Cathrine Muster W  10/18/2017, 3:04 PM

## 2017-10-18 NOTE — Progress Notes (Signed)
Discharge instructions given to patient and she verbalized understanding of all instructions provided. Written copy of AVS given to patient. 

## 2017-10-18 NOTE — Discharge Instructions (Signed)
Call if persistent headache, blurry vision, chest pain or malaise

## 2017-10-18 NOTE — Progress Notes (Signed)
Patient ID: Lynn May, female   DOB: Oct 05, 1985, 32 y.o.   MRN: [id number] Pt with good BP control on procardia 30xl BID. Will discontinue norvasc. Pt denies any HA, blurry vision or malaise. Continues to diurese well BP 126-147/79-94 today Reviewed preE precautions.  Plan for repeat BP in office on 7/29 or 7/30; also now has an arm cuff to use at home.  Reiterated postpartum care Will discharge to home at this time

## 2019-01-20 DIAGNOSIS — Z113 Encounter for screening for infections with a predominantly sexual mode of transmission: Secondary | ICD-10-CM | POA: Diagnosis not present

## 2019-01-20 DIAGNOSIS — Z01419 Encounter for gynecological examination (general) (routine) without abnormal findings: Secondary | ICD-10-CM | POA: Diagnosis not present

## 2019-01-20 DIAGNOSIS — Z3041 Encounter for surveillance of contraceptive pills: Secondary | ICD-10-CM | POA: Diagnosis not present

## 2019-01-20 DIAGNOSIS — Z6831 Body mass index (BMI) 31.0-31.9, adult: Secondary | ICD-10-CM | POA: Diagnosis not present

## 2019-01-20 DIAGNOSIS — Z1389 Encounter for screening for other disorder: Secondary | ICD-10-CM | POA: Diagnosis not present

## 2019-01-21 DIAGNOSIS — Z113 Encounter for screening for infections with a predominantly sexual mode of transmission: Secondary | ICD-10-CM | POA: Diagnosis not present

## 2019-06-11 ENCOUNTER — Encounter: Payer: Self-pay | Admitting: Certified Nurse Midwife

## 2019-11-16 DIAGNOSIS — Z20822 Contact with and (suspected) exposure to covid-19: Secondary | ICD-10-CM | POA: Diagnosis not present

## 2020-05-17 DIAGNOSIS — Z6830 Body mass index (BMI) 30.0-30.9, adult: Secondary | ICD-10-CM | POA: Diagnosis not present

## 2020-05-17 DIAGNOSIS — Z130 Encounter for screening for diseases of the blood and blood-forming organs and certain disorders involving the immune mechanism: Secondary | ICD-10-CM | POA: Diagnosis not present

## 2020-05-17 DIAGNOSIS — Z202 Contact with and (suspected) exposure to infections with a predominantly sexual mode of transmission: Secondary | ICD-10-CM | POA: Diagnosis not present

## 2020-05-17 DIAGNOSIS — Z01419 Encounter for gynecological examination (general) (routine) without abnormal findings: Secondary | ICD-10-CM | POA: Diagnosis not present

## 2020-05-17 DIAGNOSIS — Z1389 Encounter for screening for other disorder: Secondary | ICD-10-CM | POA: Diagnosis not present

## 2020-07-31 DIAGNOSIS — I10 Essential (primary) hypertension: Secondary | ICD-10-CM | POA: Diagnosis not present

## 2020-07-31 DIAGNOSIS — Z13220 Encounter for screening for lipoid disorders: Secondary | ICD-10-CM | POA: Diagnosis not present

## 2020-09-27 DIAGNOSIS — I10 Essential (primary) hypertension: Secondary | ICD-10-CM | POA: Diagnosis not present

## 2021-05-23 DIAGNOSIS — E669 Obesity, unspecified: Secondary | ICD-10-CM | POA: Diagnosis not present

## 2021-05-23 DIAGNOSIS — Z202 Contact with and (suspected) exposure to infections with a predominantly sexual mode of transmission: Secondary | ICD-10-CM | POA: Diagnosis not present

## 2021-05-23 DIAGNOSIS — Z130 Encounter for screening for diseases of the blood and blood-forming organs and certain disorders involving the immune mechanism: Secondary | ICD-10-CM | POA: Diagnosis not present

## 2021-05-23 DIAGNOSIS — Z124 Encounter for screening for malignant neoplasm of cervix: Secondary | ICD-10-CM | POA: Diagnosis not present

## 2021-05-23 DIAGNOSIS — Z01419 Encounter for gynecological examination (general) (routine) without abnormal findings: Secondary | ICD-10-CM | POA: Diagnosis not present

## 2021-05-23 DIAGNOSIS — Z1151 Encounter for screening for human papillomavirus (HPV): Secondary | ICD-10-CM | POA: Diagnosis not present

## 2021-05-30 DIAGNOSIS — Z0000 Encounter for general adult medical examination without abnormal findings: Secondary | ICD-10-CM | POA: Diagnosis not present

## 2021-05-30 DIAGNOSIS — I10 Essential (primary) hypertension: Secondary | ICD-10-CM | POA: Diagnosis not present

## 2021-05-30 DIAGNOSIS — N390 Urinary tract infection, site not specified: Secondary | ICD-10-CM | POA: Diagnosis not present

## 2021-07-25 DIAGNOSIS — R030 Elevated blood-pressure reading, without diagnosis of hypertension: Secondary | ICD-10-CM | POA: Diagnosis not present

## 2022-01-31 DIAGNOSIS — Z1231 Encounter for screening mammogram for malignant neoplasm of breast: Secondary | ICD-10-CM | POA: Diagnosis not present

## 2022-02-18 DIAGNOSIS — R928 Other abnormal and inconclusive findings on diagnostic imaging of breast: Secondary | ICD-10-CM | POA: Diagnosis not present

## 2022-02-18 DIAGNOSIS — N6321 Unspecified lump in the left breast, upper outer quadrant: Secondary | ICD-10-CM | POA: Diagnosis not present

## 2022-04-10 DIAGNOSIS — Z630 Problems in relationship with spouse or partner: Secondary | ICD-10-CM | POA: Diagnosis not present

## 2022-04-10 DIAGNOSIS — F4321 Adjustment disorder with depressed mood: Secondary | ICD-10-CM | POA: Diagnosis not present

## 2022-04-15 DIAGNOSIS — Z630 Problems in relationship with spouse or partner: Secondary | ICD-10-CM | POA: Diagnosis not present

## 2022-04-15 DIAGNOSIS — F4321 Adjustment disorder with depressed mood: Secondary | ICD-10-CM | POA: Diagnosis not present

## 2022-04-22 DIAGNOSIS — Z630 Problems in relationship with spouse or partner: Secondary | ICD-10-CM | POA: Diagnosis not present

## 2022-04-22 DIAGNOSIS — F4321 Adjustment disorder with depressed mood: Secondary | ICD-10-CM | POA: Diagnosis not present

## 2022-05-08 DIAGNOSIS — F4321 Adjustment disorder with depressed mood: Secondary | ICD-10-CM | POA: Diagnosis not present

## 2022-05-08 DIAGNOSIS — Z630 Problems in relationship with spouse or partner: Secondary | ICD-10-CM | POA: Diagnosis not present

## 2022-05-15 DIAGNOSIS — Z630 Problems in relationship with spouse or partner: Secondary | ICD-10-CM | POA: Diagnosis not present

## 2022-05-15 DIAGNOSIS — F4321 Adjustment disorder with depressed mood: Secondary | ICD-10-CM | POA: Diagnosis not present

## 2022-05-23 DIAGNOSIS — F4321 Adjustment disorder with depressed mood: Secondary | ICD-10-CM | POA: Diagnosis not present

## 2022-05-23 DIAGNOSIS — Z630 Problems in relationship with spouse or partner: Secondary | ICD-10-CM | POA: Diagnosis not present

## 2022-05-27 DIAGNOSIS — Z1389 Encounter for screening for other disorder: Secondary | ICD-10-CM | POA: Diagnosis not present

## 2022-05-27 DIAGNOSIS — Z3041 Encounter for surveillance of contraceptive pills: Secondary | ICD-10-CM | POA: Diagnosis not present

## 2022-05-27 DIAGNOSIS — Z01411 Encounter for gynecological examination (general) (routine) with abnormal findings: Secondary | ICD-10-CM | POA: Diagnosis not present

## 2022-05-27 DIAGNOSIS — Z130 Encounter for screening for diseases of the blood and blood-forming organs and certain disorders involving the immune mechanism: Secondary | ICD-10-CM | POA: Diagnosis not present

## 2022-05-28 DIAGNOSIS — Z202 Contact with and (suspected) exposure to infections with a predominantly sexual mode of transmission: Secondary | ICD-10-CM | POA: Diagnosis not present

## 2022-05-30 DIAGNOSIS — Z630 Problems in relationship with spouse or partner: Secondary | ICD-10-CM | POA: Diagnosis not present

## 2022-05-30 DIAGNOSIS — F4321 Adjustment disorder with depressed mood: Secondary | ICD-10-CM | POA: Diagnosis not present

## 2022-05-31 DIAGNOSIS — Z0000 Encounter for general adult medical examination without abnormal findings: Secondary | ICD-10-CM | POA: Diagnosis not present

## 2022-05-31 DIAGNOSIS — Z113 Encounter for screening for infections with a predominantly sexual mode of transmission: Secondary | ICD-10-CM | POA: Diagnosis not present

## 2022-05-31 DIAGNOSIS — I10 Essential (primary) hypertension: Secondary | ICD-10-CM | POA: Diagnosis not present

## 2022-05-31 DIAGNOSIS — Z1159 Encounter for screening for other viral diseases: Secondary | ICD-10-CM | POA: Diagnosis not present

## 2022-05-31 DIAGNOSIS — Z13220 Encounter for screening for lipoid disorders: Secondary | ICD-10-CM | POA: Diagnosis not present

## 2022-06-10 DIAGNOSIS — F4321 Adjustment disorder with depressed mood: Secondary | ICD-10-CM | POA: Diagnosis not present

## 2022-06-10 DIAGNOSIS — Z630 Problems in relationship with spouse or partner: Secondary | ICD-10-CM | POA: Diagnosis not present

## 2022-06-24 DIAGNOSIS — Z630 Problems in relationship with spouse or partner: Secondary | ICD-10-CM | POA: Diagnosis not present

## 2022-06-24 DIAGNOSIS — F4321 Adjustment disorder with depressed mood: Secondary | ICD-10-CM | POA: Diagnosis not present

## 2022-07-09 DIAGNOSIS — Z630 Problems in relationship with spouse or partner: Secondary | ICD-10-CM | POA: Diagnosis not present

## 2022-07-09 DIAGNOSIS — F4321 Adjustment disorder with depressed mood: Secondary | ICD-10-CM | POA: Diagnosis not present

## 2022-07-23 DIAGNOSIS — Z630 Problems in relationship with spouse or partner: Secondary | ICD-10-CM | POA: Diagnosis not present

## 2022-07-23 DIAGNOSIS — F4321 Adjustment disorder with depressed mood: Secondary | ICD-10-CM | POA: Diagnosis not present

## 2022-08-06 DIAGNOSIS — Z630 Problems in relationship with spouse or partner: Secondary | ICD-10-CM | POA: Diagnosis not present

## 2022-08-06 DIAGNOSIS — F4321 Adjustment disorder with depressed mood: Secondary | ICD-10-CM | POA: Diagnosis not present

## 2022-08-20 DIAGNOSIS — Z630 Problems in relationship with spouse or partner: Secondary | ICD-10-CM | POA: Diagnosis not present

## 2022-08-20 DIAGNOSIS — F4321 Adjustment disorder with depressed mood: Secondary | ICD-10-CM | POA: Diagnosis not present

## 2022-08-23 DIAGNOSIS — R928 Other abnormal and inconclusive findings on diagnostic imaging of breast: Secondary | ICD-10-CM | POA: Diagnosis not present

## 2022-08-23 DIAGNOSIS — N6321 Unspecified lump in the left breast, upper outer quadrant: Secondary | ICD-10-CM | POA: Diagnosis not present

## 2022-09-03 DIAGNOSIS — F4321 Adjustment disorder with depressed mood: Secondary | ICD-10-CM | POA: Diagnosis not present

## 2022-09-03 DIAGNOSIS — Z630 Problems in relationship with spouse or partner: Secondary | ICD-10-CM | POA: Diagnosis not present

## 2022-09-18 DIAGNOSIS — F4321 Adjustment disorder with depressed mood: Secondary | ICD-10-CM | POA: Diagnosis not present

## 2022-09-18 DIAGNOSIS — Z630 Problems in relationship with spouse or partner: Secondary | ICD-10-CM | POA: Diagnosis not present

## 2022-10-01 DIAGNOSIS — F4321 Adjustment disorder with depressed mood: Secondary | ICD-10-CM | POA: Diagnosis not present

## 2022-10-01 DIAGNOSIS — Z630 Problems in relationship with spouse or partner: Secondary | ICD-10-CM | POA: Diagnosis not present

## 2022-10-15 DIAGNOSIS — F4321 Adjustment disorder with depressed mood: Secondary | ICD-10-CM | POA: Diagnosis not present

## 2022-10-15 DIAGNOSIS — Z630 Problems in relationship with spouse or partner: Secondary | ICD-10-CM | POA: Diagnosis not present

## 2022-10-29 DIAGNOSIS — Z630 Problems in relationship with spouse or partner: Secondary | ICD-10-CM | POA: Diagnosis not present

## 2022-10-29 DIAGNOSIS — F4321 Adjustment disorder with depressed mood: Secondary | ICD-10-CM | POA: Diagnosis not present

## 2022-11-14 DIAGNOSIS — F4321 Adjustment disorder with depressed mood: Secondary | ICD-10-CM | POA: Diagnosis not present

## 2022-11-14 DIAGNOSIS — Z630 Problems in relationship with spouse or partner: Secondary | ICD-10-CM | POA: Diagnosis not present

## 2022-11-26 DIAGNOSIS — Z630 Problems in relationship with spouse or partner: Secondary | ICD-10-CM | POA: Diagnosis not present

## 2022-11-26 DIAGNOSIS — F4321 Adjustment disorder with depressed mood: Secondary | ICD-10-CM | POA: Diagnosis not present

## 2022-12-18 DIAGNOSIS — Z630 Problems in relationship with spouse or partner: Secondary | ICD-10-CM | POA: Diagnosis not present

## 2022-12-18 DIAGNOSIS — F4321 Adjustment disorder with depressed mood: Secondary | ICD-10-CM | POA: Diagnosis not present

## 2022-12-30 DIAGNOSIS — F4321 Adjustment disorder with depressed mood: Secondary | ICD-10-CM | POA: Diagnosis not present

## 2022-12-30 DIAGNOSIS — Z630 Problems in relationship with spouse or partner: Secondary | ICD-10-CM | POA: Diagnosis not present

## 2023-01-16 DIAGNOSIS — Z630 Problems in relationship with spouse or partner: Secondary | ICD-10-CM | POA: Diagnosis not present

## 2023-01-16 DIAGNOSIS — F4321 Adjustment disorder with depressed mood: Secondary | ICD-10-CM | POA: Diagnosis not present

## 2023-01-30 DIAGNOSIS — F4321 Adjustment disorder with depressed mood: Secondary | ICD-10-CM | POA: Diagnosis not present

## 2023-01-30 DIAGNOSIS — Z630 Problems in relationship with spouse or partner: Secondary | ICD-10-CM | POA: Diagnosis not present

## 2023-02-13 DIAGNOSIS — F4321 Adjustment disorder with depressed mood: Secondary | ICD-10-CM | POA: Diagnosis not present

## 2023-02-13 DIAGNOSIS — Z630 Problems in relationship with spouse or partner: Secondary | ICD-10-CM | POA: Diagnosis not present

## 2023-02-24 DIAGNOSIS — F4321 Adjustment disorder with depressed mood: Secondary | ICD-10-CM | POA: Diagnosis not present

## 2023-02-24 DIAGNOSIS — Z630 Problems in relationship with spouse or partner: Secondary | ICD-10-CM | POA: Diagnosis not present

## 2023-03-03 DIAGNOSIS — N6321 Unspecified lump in the left breast, upper outer quadrant: Secondary | ICD-10-CM | POA: Diagnosis not present

## 2023-03-07 DIAGNOSIS — N6321 Unspecified lump in the left breast, upper outer quadrant: Secondary | ICD-10-CM | POA: Diagnosis not present

## 2023-03-07 DIAGNOSIS — N63 Unspecified lump in unspecified breast: Secondary | ICD-10-CM | POA: Diagnosis not present

## 2023-03-07 DIAGNOSIS — R92332 Mammographic heterogeneous density, left breast: Secondary | ICD-10-CM | POA: Diagnosis not present

## 2023-03-12 DIAGNOSIS — Z630 Problems in relationship with spouse or partner: Secondary | ICD-10-CM | POA: Diagnosis not present

## 2023-03-12 DIAGNOSIS — F4321 Adjustment disorder with depressed mood: Secondary | ICD-10-CM | POA: Diagnosis not present

## 2023-03-27 DIAGNOSIS — F4321 Adjustment disorder with depressed mood: Secondary | ICD-10-CM | POA: Diagnosis not present

## 2023-03-27 DIAGNOSIS — Z630 Problems in relationship with spouse or partner: Secondary | ICD-10-CM | POA: Diagnosis not present

## 2023-04-08 DIAGNOSIS — F4321 Adjustment disorder with depressed mood: Secondary | ICD-10-CM | POA: Diagnosis not present

## 2023-04-08 DIAGNOSIS — Z630 Problems in relationship with spouse or partner: Secondary | ICD-10-CM | POA: Diagnosis not present

## 2023-04-24 DIAGNOSIS — F4321 Adjustment disorder with depressed mood: Secondary | ICD-10-CM | POA: Diagnosis not present

## 2023-04-24 DIAGNOSIS — Z630 Problems in relationship with spouse or partner: Secondary | ICD-10-CM | POA: Diagnosis not present

## 2023-05-05 DIAGNOSIS — F4321 Adjustment disorder with depressed mood: Secondary | ICD-10-CM | POA: Diagnosis not present

## 2023-05-05 DIAGNOSIS — Z630 Problems in relationship with spouse or partner: Secondary | ICD-10-CM | POA: Diagnosis not present

## 2023-05-22 DIAGNOSIS — F4321 Adjustment disorder with depressed mood: Secondary | ICD-10-CM | POA: Diagnosis not present

## 2023-05-22 DIAGNOSIS — Z630 Problems in relationship with spouse or partner: Secondary | ICD-10-CM | POA: Diagnosis not present

## 2023-06-03 DIAGNOSIS — I10 Essential (primary) hypertension: Secondary | ICD-10-CM | POA: Diagnosis not present

## 2023-06-03 DIAGNOSIS — Z130 Encounter for screening for diseases of the blood and blood-forming organs and certain disorders involving the immune mechanism: Secondary | ICD-10-CM | POA: Diagnosis not present

## 2023-06-03 DIAGNOSIS — Z113 Encounter for screening for infections with a predominantly sexual mode of transmission: Secondary | ICD-10-CM | POA: Diagnosis not present

## 2023-06-03 DIAGNOSIS — Z202 Contact with and (suspected) exposure to infections with a predominantly sexual mode of transmission: Secondary | ICD-10-CM | POA: Diagnosis not present

## 2023-06-03 DIAGNOSIS — Z01411 Encounter for gynecological examination (general) (routine) with abnormal findings: Secondary | ICD-10-CM | POA: Diagnosis not present

## 2023-06-05 DIAGNOSIS — F4321 Adjustment disorder with depressed mood: Secondary | ICD-10-CM | POA: Diagnosis not present

## 2023-06-05 DIAGNOSIS — Z630 Problems in relationship with spouse or partner: Secondary | ICD-10-CM | POA: Diagnosis not present

## 2023-06-17 DIAGNOSIS — Z630 Problems in relationship with spouse or partner: Secondary | ICD-10-CM | POA: Diagnosis not present

## 2023-06-17 DIAGNOSIS — F4321 Adjustment disorder with depressed mood: Secondary | ICD-10-CM | POA: Diagnosis not present

## 2023-06-23 DIAGNOSIS — Z0000 Encounter for general adult medical examination without abnormal findings: Secondary | ICD-10-CM | POA: Diagnosis not present

## 2023-06-23 DIAGNOSIS — Z13220 Encounter for screening for lipoid disorders: Secondary | ICD-10-CM | POA: Diagnosis not present

## 2023-06-23 DIAGNOSIS — I10 Essential (primary) hypertension: Secondary | ICD-10-CM | POA: Diagnosis not present

## 2023-07-01 DIAGNOSIS — Z630 Problems in relationship with spouse or partner: Secondary | ICD-10-CM | POA: Diagnosis not present

## 2023-07-15 DIAGNOSIS — Z630 Problems in relationship with spouse or partner: Secondary | ICD-10-CM | POA: Diagnosis not present

## 2023-07-15 DIAGNOSIS — F4321 Adjustment disorder with depressed mood: Secondary | ICD-10-CM | POA: Diagnosis not present

## 2023-07-29 DIAGNOSIS — F4321 Adjustment disorder with depressed mood: Secondary | ICD-10-CM | POA: Diagnosis not present

## 2023-07-29 DIAGNOSIS — Z630 Problems in relationship with spouse or partner: Secondary | ICD-10-CM | POA: Diagnosis not present

## 2023-08-21 DIAGNOSIS — F4321 Adjustment disorder with depressed mood: Secondary | ICD-10-CM | POA: Diagnosis not present

## 2023-08-21 DIAGNOSIS — Z630 Problems in relationship with spouse or partner: Secondary | ICD-10-CM | POA: Diagnosis not present

## 2023-09-02 DIAGNOSIS — F4321 Adjustment disorder with depressed mood: Secondary | ICD-10-CM | POA: Diagnosis not present

## 2023-09-02 DIAGNOSIS — Z630 Problems in relationship with spouse or partner: Secondary | ICD-10-CM | POA: Diagnosis not present

## 2023-09-12 DIAGNOSIS — Z202 Contact with and (suspected) exposure to infections with a predominantly sexual mode of transmission: Secondary | ICD-10-CM | POA: Diagnosis not present

## 2023-09-12 DIAGNOSIS — A64 Unspecified sexually transmitted disease: Secondary | ICD-10-CM | POA: Diagnosis not present

## 2023-09-15 DIAGNOSIS — Z630 Problems in relationship with spouse or partner: Secondary | ICD-10-CM | POA: Diagnosis not present

## 2023-09-15 DIAGNOSIS — F4321 Adjustment disorder with depressed mood: Secondary | ICD-10-CM | POA: Diagnosis not present

## 2023-09-29 DIAGNOSIS — Z630 Problems in relationship with spouse or partner: Secondary | ICD-10-CM | POA: Diagnosis not present

## 2023-09-29 DIAGNOSIS — F4321 Adjustment disorder with depressed mood: Secondary | ICD-10-CM | POA: Diagnosis not present

## 2023-09-30 DIAGNOSIS — R399 Unspecified symptoms and signs involving the genitourinary system: Secondary | ICD-10-CM | POA: Diagnosis not present

## 2023-10-16 DIAGNOSIS — Z630 Problems in relationship with spouse or partner: Secondary | ICD-10-CM | POA: Diagnosis not present

## 2023-10-16 DIAGNOSIS — F4321 Adjustment disorder with depressed mood: Secondary | ICD-10-CM | POA: Diagnosis not present

## 2023-10-27 DIAGNOSIS — N390 Urinary tract infection, site not specified: Secondary | ICD-10-CM | POA: Diagnosis not present

## 2023-10-29 DIAGNOSIS — F4321 Adjustment disorder with depressed mood: Secondary | ICD-10-CM | POA: Diagnosis not present

## 2023-10-29 DIAGNOSIS — Z630 Problems in relationship with spouse or partner: Secondary | ICD-10-CM | POA: Diagnosis not present

## 2023-11-13 DIAGNOSIS — Z630 Problems in relationship with spouse or partner: Secondary | ICD-10-CM | POA: Diagnosis not present

## 2023-11-13 DIAGNOSIS — F4321 Adjustment disorder with depressed mood: Secondary | ICD-10-CM | POA: Diagnosis not present

## 2023-11-27 DIAGNOSIS — F4321 Adjustment disorder with depressed mood: Secondary | ICD-10-CM | POA: Diagnosis not present

## 2023-11-27 DIAGNOSIS — Z630 Problems in relationship with spouse or partner: Secondary | ICD-10-CM | POA: Diagnosis not present

## 2023-12-11 DIAGNOSIS — F4321 Adjustment disorder with depressed mood: Secondary | ICD-10-CM | POA: Diagnosis not present

## 2023-12-11 DIAGNOSIS — Z630 Problems in relationship with spouse or partner: Secondary | ICD-10-CM | POA: Diagnosis not present

## 2023-12-25 DIAGNOSIS — Z630 Problems in relationship with spouse or partner: Secondary | ICD-10-CM | POA: Diagnosis not present

## 2023-12-25 DIAGNOSIS — F4321 Adjustment disorder with depressed mood: Secondary | ICD-10-CM | POA: Diagnosis not present

## 2024-01-02 DIAGNOSIS — N898 Other specified noninflammatory disorders of vagina: Secondary | ICD-10-CM | POA: Diagnosis not present

## 2024-01-02 DIAGNOSIS — R8290 Unspecified abnormal findings in urine: Secondary | ICD-10-CM | POA: Diagnosis not present

## 2024-01-02 DIAGNOSIS — R232 Flushing: Secondary | ICD-10-CM | POA: Diagnosis not present

## 2024-01-02 DIAGNOSIS — I10 Essential (primary) hypertension: Secondary | ICD-10-CM | POA: Diagnosis not present

## 2024-01-06 DIAGNOSIS — F4321 Adjustment disorder with depressed mood: Secondary | ICD-10-CM | POA: Diagnosis not present

## 2024-01-06 DIAGNOSIS — Z630 Problems in relationship with spouse or partner: Secondary | ICD-10-CM | POA: Diagnosis not present

## 2024-01-09 DIAGNOSIS — R8290 Unspecified abnormal findings in urine: Secondary | ICD-10-CM | POA: Diagnosis not present

## 2024-01-21 DIAGNOSIS — F4321 Adjustment disorder with depressed mood: Secondary | ICD-10-CM | POA: Diagnosis not present

## 2024-01-21 DIAGNOSIS — Z630 Problems in relationship with spouse or partner: Secondary | ICD-10-CM | POA: Diagnosis not present

## 2024-02-05 DIAGNOSIS — F4321 Adjustment disorder with depressed mood: Secondary | ICD-10-CM | POA: Diagnosis not present

## 2024-02-05 DIAGNOSIS — Z630 Problems in relationship with spouse or partner: Secondary | ICD-10-CM | POA: Diagnosis not present

## 2024-02-17 DIAGNOSIS — Z630 Problems in relationship with spouse or partner: Secondary | ICD-10-CM | POA: Diagnosis not present

## 2024-02-17 DIAGNOSIS — F4321 Adjustment disorder with depressed mood: Secondary | ICD-10-CM | POA: Diagnosis not present

## 2024-03-09 DIAGNOSIS — F4321 Adjustment disorder with depressed mood: Secondary | ICD-10-CM | POA: Diagnosis not present

## 2024-03-09 DIAGNOSIS — Z630 Problems in relationship with spouse or partner: Secondary | ICD-10-CM | POA: Diagnosis not present

## 2024-03-23 DIAGNOSIS — F4321 Adjustment disorder with depressed mood: Secondary | ICD-10-CM | POA: Diagnosis not present

## 2024-03-23 DIAGNOSIS — Z630 Problems in relationship with spouse or partner: Secondary | ICD-10-CM | POA: Diagnosis not present
